# Patient Record
Sex: Female | Born: 1935
Health system: Southern US, Community
[De-identification: ages and names within clinical notes are randomized; demographics above are authoritative.]

## PROBLEM LIST (undated history)

## (undated) DIAGNOSIS — F0280 Dementia in other diseases classified elsewhere without behavioral disturbance: Secondary | ICD-10-CM

## (undated) DIAGNOSIS — E785 Hyperlipidemia, unspecified: Secondary | ICD-10-CM

## (undated) DIAGNOSIS — G309 Alzheimer's disease, unspecified: Secondary | ICD-10-CM

## (undated) DIAGNOSIS — M899 Disorder of bone, unspecified: Secondary | ICD-10-CM

## (undated) DIAGNOSIS — L03319 Cellulitis of trunk, unspecified: Secondary | ICD-10-CM

## (undated) DIAGNOSIS — M25569 Pain in unspecified knee: Secondary | ICD-10-CM

## (undated) DIAGNOSIS — L02219 Cutaneous abscess of trunk, unspecified: Secondary | ICD-10-CM

## (undated) DIAGNOSIS — F0390 Unspecified dementia without behavioral disturbance: Secondary | ICD-10-CM

## (undated) DIAGNOSIS — E119 Type 2 diabetes mellitus without complications: Secondary | ICD-10-CM

## (undated) DIAGNOSIS — I739 Peripheral vascular disease, unspecified: Secondary | ICD-10-CM

## (undated) DIAGNOSIS — I10 Essential (primary) hypertension: Secondary | ICD-10-CM

## (undated) DIAGNOSIS — M6281 Muscle weakness (generalized): Secondary | ICD-10-CM

## (undated) DIAGNOSIS — R7309 Other abnormal glucose: Secondary | ICD-10-CM

## (undated) DIAGNOSIS — D649 Anemia, unspecified: Secondary | ICD-10-CM

## (undated) DIAGNOSIS — M949 Disorder of cartilage, unspecified: Secondary | ICD-10-CM

## (undated) DIAGNOSIS — D72829 Elevated white blood cell count, unspecified: Secondary | ICD-10-CM

## (undated) HISTORY — DX: Muscle weakness (generalized): M62.81

## (undated) HISTORY — PX: OOPHORECTOMY: SHX86

## (undated) HISTORY — DX: Dementia in other diseases classified elsewhere, unspecified severity, without behavioral disturbance, psychotic disturbance, mood disturbance, and anxiety: F02.80

## (undated) HISTORY — DX: Disorder of bone, unspecified: M89.9

## (undated) HISTORY — DX: Cutaneous abscess of trunk, unspecified: L02.219

## (undated) HISTORY — DX: Hyperlipidemia, unspecified: E78.5

## (undated) HISTORY — DX: Peripheral vascular disease, unspecified: I73.9

## (undated) HISTORY — DX: Pain in unspecified knee: M25.569

## (undated) HISTORY — DX: Alzheimer's disease, unspecified: G30.9

## (undated) HISTORY — DX: Other abnormal glucose: R73.09

## (undated) HISTORY — DX: Essential (primary) hypertension: I10

## (undated) HISTORY — PX: TONSILLECTOMY: SHX5217

## (undated) HISTORY — DX: Anemia, unspecified: D64.9

## (undated) HISTORY — DX: Unspecified dementia, unspecified severity, without behavioral disturbance, psychotic disturbance, mood disturbance, and anxiety: F03.90

## (undated) HISTORY — DX: Type 2 diabetes mellitus without complications: E11.9

## (undated) HISTORY — DX: Cellulitis of trunk, unspecified: L03.319

## (undated) HISTORY — DX: Disorder of cartilage, unspecified: M94.9

## (undated) HISTORY — DX: Elevated white blood cell count, unspecified: D72.829

---

## 2009-12-05 ENCOUNTER — Encounter: Admission: RE | Admit: 2009-12-05 | Discharge: 2009-12-05 | Payer: Self-pay | Admitting: Internal Medicine

## 2011-01-08 ENCOUNTER — Other Ambulatory Visit: Payer: Self-pay | Admitting: Internal Medicine

## 2011-01-08 DIAGNOSIS — M25569 Pain in unspecified knee: Secondary | ICD-10-CM

## 2011-01-08 DIAGNOSIS — R0989 Other specified symptoms and signs involving the circulatory and respiratory systems: Secondary | ICD-10-CM

## 2011-01-12 ENCOUNTER — Ambulatory Visit
Admission: RE | Admit: 2011-01-12 | Discharge: 2011-01-12 | Disposition: A | Discharge status: Home / Self Care | Payer: Medicare Other | Source: Ambulatory Visit | Attending: Internal Medicine | Admitting: Internal Medicine

## 2011-01-12 ENCOUNTER — Other Ambulatory Visit: Payer: Self-pay

## 2011-01-12 DIAGNOSIS — R0989 Other specified symptoms and signs involving the circulatory and respiratory systems: Secondary | ICD-10-CM

## 2011-01-12 DIAGNOSIS — M25569 Pain in unspecified knee: Secondary | ICD-10-CM

## 2011-01-13 ENCOUNTER — Other Ambulatory Visit: Payer: Self-pay

## 2011-02-03 ENCOUNTER — Encounter (INDEPENDENT_AMBULATORY_CARE_PROVIDER_SITE_OTHER): Payer: Medicare Other | Admitting: Vascular Surgery

## 2011-02-03 DIAGNOSIS — M79609 Pain in unspecified limb: Secondary | ICD-10-CM

## 2011-02-03 NOTE — Consult Note (Signed)
NEW PATIENT CONSULTATION  Anne Hernandez, BONNELL CONNAREE DOB:  10/15/1930                                       02/03/2011 [id number]#:[id number]  Patient presents today for evaluation of potential lower extremity arterial insufficiency.  She is a very active, pleasant 77 year old white female who was found by Dr. Allena Katz to have diminished pedal pulses. This prompted a noninvasive vascular laboratory study showing mild lower extremity arterial insufficiency, and I am seeing her for further discussion.  Patient specifically denies any rest pain, tissue loss, and specifically denies any lower extremity claudication-type symptoms.  She is in a quite active walking program.  PAST HISTORY:  Significant only for hypertension.  SOCIAL HISTORY:  She is single with no children.  She does not smoke or drink alcohol.  REVIEW OF SYSTEMS:  Positive for weight gain.  She is up to 127 pounds. She is 5 feet 2 inches tall.  She does have arthritic and joint pain, most specifically in her knee, otherwise her review of systems is completely negative.  PHYSICAL EXAMINATION:  A well-developed and well-nourished white female appearing her stated age in no acute stress.  Blood pressure is 151/80, pulse 80, respirations 18.  HEENT is normal.  Her carotid arteries are without bruits bilaterally.  Heart:  Regular rate and rhythm.  Chest: Clear bilaterally without rales, rhonchi, or wheezes.  Abdomen:  Soft, nontender.  No masses noted.  Musculoskeletal shows no major deformities or cyanosis.  Neurologic:  No focal weakness or paresthesias.  Skin without ulcers or rashes.  She does have 2+ radial, 2+ femoral, 2+ popliteal, and 1-2+ dorsalis pedis pulses bilaterally.  I reviewed her noninvasive vascular laboratory studies with patient and her sister present.  I explained that this did show mild arterial insufficiency with an ankle-arm index of 0.72 on the left and 0.82 on the right.  She is not  having any symptoms related to this, and therefore I would recommend no further evaluation.  I explained the symptoms of claudication.  She will notify us should this occur, otherwise I explained that the most critical thing she is already doing is continuing her walking program.  She was reassured with this discussion and will see [place] again on an as-needed basis.    Larina Earthly, M.D. Electronically Signed  TFE/MEDQ  D:  02/03/2011  T:  02/03/2011  Job:  [id number]  cc:   Doran Durand, MD

## 2012-10-22 ENCOUNTER — Encounter: Payer: Self-pay | Admitting: Geriatric Medicine

## 2013-01-24 ENCOUNTER — Ambulatory Visit (INDEPENDENT_AMBULATORY_CARE_PROVIDER_SITE_OTHER): Payer: Medicare Other | Admitting: Internal Medicine

## 2013-01-24 ENCOUNTER — Encounter: Payer: Self-pay | Admitting: Geriatric Medicine

## 2013-01-24 ENCOUNTER — Telehealth: Payer: Self-pay | Admitting: Geriatric Medicine

## 2013-01-24 ENCOUNTER — Encounter: Payer: Self-pay | Admitting: Internal Medicine

## 2013-01-24 VITALS — BP 124/72 | HR 64 | Temp 97.6°F | Wt 121.0 lb

## 2013-01-24 DIAGNOSIS — L02219 Cutaneous abscess of trunk, unspecified: Secondary | ICD-10-CM

## 2013-01-24 DIAGNOSIS — I10 Essential (primary) hypertension: Secondary | ICD-10-CM

## 2013-01-24 DIAGNOSIS — E119 Type 2 diabetes mellitus without complications: Secondary | ICD-10-CM

## 2013-01-24 DIAGNOSIS — D72829 Elevated white blood cell count, unspecified: Secondary | ICD-10-CM

## 2013-01-24 DIAGNOSIS — E785 Hyperlipidemia, unspecified: Secondary | ICD-10-CM

## 2013-01-24 DIAGNOSIS — F0280 Dementia in other diseases classified elsewhere without behavioral disturbance: Secondary | ICD-10-CM

## 2013-01-24 DIAGNOSIS — D649 Anemia, unspecified: Secondary | ICD-10-CM

## 2013-01-24 DIAGNOSIS — M25569 Pain in unspecified knee: Secondary | ICD-10-CM

## 2013-01-24 DIAGNOSIS — L308 Other specified dermatitis: Secondary | ICD-10-CM

## 2013-01-24 DIAGNOSIS — M6281 Muscle weakness (generalized): Secondary | ICD-10-CM

## 2013-01-24 DIAGNOSIS — F0390 Unspecified dementia without behavioral disturbance: Secondary | ICD-10-CM

## 2013-01-24 DIAGNOSIS — M899 Disorder of bone, unspecified: Secondary | ICD-10-CM

## 2013-01-24 DIAGNOSIS — L299 Pruritus, unspecified: Secondary | ICD-10-CM

## 2013-01-24 DIAGNOSIS — R7309 Other abnormal glucose: Secondary | ICD-10-CM

## 2013-01-24 DIAGNOSIS — I739 Peripheral vascular disease, unspecified: Secondary | ICD-10-CM

## 2013-01-24 MED ORDER — CETIRIZINE HCL 10 MG PO TABS: 10.0000 mg | ORAL_TABLET | Freq: Every day | ORAL | Status: DC

## 2013-01-24 MED ORDER — HYDROCORTISONE 0.5 % EX CREA: TOPICAL_CREAM | Freq: Two times a day (BID) | CUTANEOUS | Status: DC

## 2013-01-24 MED ORDER — VASOTEC 10 MG PO TABS: 10.0000 mg | ORAL_TABLET | Freq: Every day | ORAL | Status: DC

## 2013-01-24 MED ORDER — HYDROCORTISONE 0.5 % EX CREA: TOPICAL_CREAM | Freq: Two times a day (BID) | CUTANEOUS | Status: AC

## 2013-01-24 MED ORDER — MEMANTINE HCL 28 X 5 MG & 21 X 10 MG PO TABS: ORAL_TABLET | ORAL | Status: DC

## 2013-01-24 NOTE — Assessment & Plan Note (Signed)
a1c in 9/13 was 6.6. Is currently diet controlled. Will check a1c prior to next visit. On ACEI

## 2013-01-24 NOTE — Assessment & Plan Note (Signed)
Elevated lipid level last visit- on diet, did not want simvastatin last viist. Recheck flp prior to next visit

## 2013-01-24 NOTE — Progress Notes (Signed)
  Subjective:    Patient ID: Anne Hernandez, female    DOB: 07-Dec-1929, 79 y.o.   MRN: [id number]  HPI 79 y/o female patient here with her sister for routine follow up visit. She has been having itching on face and neck for few months now. It can itch any time and she tries not to scratch them but in her sleep tends to. She has open sores on her face and neck now from itching and is using triple antibiotic cream for now. No rash or lesion reported elsewhere. Dementia persists but no acute behavioral changes reported. No assistive device used She is living at [facility] housing in [place].    Review of Systems  Constitutional: Negative for appetite change and fatigue.  HENT: Negative.  Negative for ear pain.   Respiratory: Negative.   Cardiovascular: Negative for chest pain, palpitations and leg swelling.  Gastrointestinal: Negative for constipation.  Musculoskeletal: Negative.   Neurological: Negative for dizziness.  Psychiatric/Behavioral: Negative.        Objective:   Physical Exam  Constitutional: She appears well-developed and well-nourished.  HENT:  Head: Normocephalic.  Eyes: Pupils are equal, round, and reactive to light.  Cardiovascular: Normal rate and regular rhythm.   Pulmonary/Chest: Effort normal and breath sounds normal.  Abdominal: Soft.  Musculoskeletal: Normal range of motion.  Neurological: She is alert.  Oriented to person and place but not to time, pleasant with conversation  Skin: Skin is warm. Rash noted. There is erythema.  Has scratch marks and open aream of 1 cm in forehead with erythema but no drainage. Has erythematous areas with thickened skin on the nape of her neck  Psychiatric: She has a normal mood and affect.          Assessment & Plan:

## 2013-01-24 NOTE — Assessment & Plan Note (Signed)
bp well controlled with current regimne. bp readings have been normal past few visits. On enalapril-hctz 10-25 mg daily. Will change this to enalapril 10 mg daily for now and monitor. Warning signs with elevated bp explained to pt. bp to be checked once a week in [facility] and to notify if reading persistently > 140/90

## 2013-01-24 NOTE — Progress Notes (Signed)
error 

## 2013-01-24 NOTE — Assessment & Plan Note (Signed)
On donepezil 10 mg daily. Memory problem persists and sister concerned with pt being more forgetful. No behavior changes reported. Will add namenda titration pack and explained to pt and sister of common side effects and mode of administration

## 2013-01-24 NOTE — Patient Instructions (Addendum)
To apply cream as prescribed on face twice a day.  If you notice gi upset, nausea, vomiting, abdominal pain or loss of appetite while using namenda, to notify the office Blood pressure to be checked once a week at home and to notify office if bp reading persistently > 140/90 i will see you in office in 2 weeks

## 2013-01-24 NOTE — Assessment & Plan Note (Signed)
On ca-vit d and otc vit d supplement- no falls

## 2013-02-07 ENCOUNTER — Other Ambulatory Visit: Payer: Self-pay | Admitting: Internal Medicine

## 2013-02-07 ENCOUNTER — Ambulatory Visit (INDEPENDENT_AMBULATORY_CARE_PROVIDER_SITE_OTHER): Payer: Medicare Other | Admitting: Internal Medicine

## 2013-02-07 ENCOUNTER — Other Ambulatory Visit: Payer: Self-pay | Admitting: Geriatric Medicine

## 2013-02-07 ENCOUNTER — Encounter: Payer: Self-pay | Admitting: Internal Medicine

## 2013-02-07 VITALS — BP 160/74 | HR 57 | Temp 97.7°F | Resp 14 | Wt 124.0 lb

## 2013-02-07 DIAGNOSIS — I10 Essential (primary) hypertension: Secondary | ICD-10-CM

## 2013-02-07 DIAGNOSIS — E119 Type 2 diabetes mellitus without complications: Secondary | ICD-10-CM

## 2013-02-07 DIAGNOSIS — F0280 Dementia in other diseases classified elsewhere without behavioral disturbance: Secondary | ICD-10-CM

## 2013-02-07 DIAGNOSIS — G309 Alzheimer's disease, unspecified: Secondary | ICD-10-CM

## 2013-02-07 DIAGNOSIS — L299 Pruritus, unspecified: Secondary | ICD-10-CM

## 2013-02-07 MED ORDER — LISINOPRIL-HYDROCHLOROTHIAZIDE 10-12.5 MG PO TABS: 1.0000 | ORAL_TABLET | Freq: Every day | ORAL | Status: DC

## 2013-02-07 MED ORDER — DONEPEZIL HCL 10 MG PO TABS: 10.0000 mg | ORAL_TABLET | Freq: Two times a day (BID) | ORAL | Status: DC

## 2013-02-07 NOTE — Assessment & Plan Note (Signed)
With bp remaining elevated will change vasotec to lisinopril-hctz 10-12.5 mg daily and reassess. Will have her bp be checked in the facility twice a week

## 2013-02-07 NOTE — Assessment & Plan Note (Signed)
Diet controlled. Will check a1c today

## 2013-02-07 NOTE — Assessment & Plan Note (Signed)
New erythema in upper lip today. The one in her neck and her forehead have resolved. Continue prn zyrtec and hydrocortisone cream for now. Pt encouraged to avoid picking on her face if possible. Nails are trimmed. Due to her memory issue, this can be a limitation though

## 2013-02-07 NOTE — Patient Instructions (Signed)
You will need your blood pressure checked at least twice a week in the facility ([facility]). Please have the facility call office and notify if bp reading is greater than 140/90 in 2 or more readings.  Two medication adjustment has been made as discussed this visit with you and your sister

## 2013-02-07 NOTE — Assessment & Plan Note (Signed)
Cannot take namenda due to cost issue. It was started last visit. Will increase donepezil to 10 mg bid for now.common side effects explained

## 2013-02-07 NOTE — Progress Notes (Signed)
Patient ID: Anne Hernandez, female   DOB: [birth year], 79 y.o.   MRN: [id number]  Chief Complaint  Patient presents with  . Medical Managment of Chronic Issues    dementia and rash   Allergies  Allergen Reactions  . Penicillins    HPI- Pt here for follow up on her memory and blood pressure. Her blood pressure has remained elevated since adjustment of the dose. She was not able to take namenda due to cost issue. No behavioral changes reported. Compliant with medication She continues to pick on her skin, says it is due to itching and has a new lesion on her upper lip area. Lesions present on last visit have resolved. No other complaints See ros  Review of Systems  Constitutional: Negative for fever and chills.  HENT: Negative for congestion.   Eyes: Negative for blurred vision.  Respiratory: Negative for cough and shortness of breath.   Cardiovascular: Negative for chest pain, palpitations and leg swelling.  Gastrointestinal: Negative for heartburn.  Genitourinary: Negative for dysuria.  Neurological: Negative for dizziness, weakness and headaches.  Psychiatric/Behavioral: Positive for memory loss.   BP 160/74  Pulse 57  Temp(Src) 97.7 F (36.5 C) (Oral)  Resp 14  Wt 124 lb (56.246 kg)  SpO2 97%  .Physical Exam  Constitutional: She appears well-developed and well-nourished.  HENT:  Head: Normocephalic and atraumatic.  Mouth/Throat: Oropharynx is clear and moist.  Eyes: Conjunctivae are normal. Pupils are equal, round, and reactive to light.  Neck: Normal range of motion. Neck supple.  Cardiovascular: Normal rate and regular rhythm.   Pulmonary/Chest: Effort normal and breath sounds normal.  Abdominal: Soft. Bowel sounds are normal.  Musculoskeletal: Normal range of motion. She exhibits no edema.  Neurological: She is alert.  Oriented to person and place and time  Skin: Skin is warm and dry. Rash noted. There is erythema.  Above upper lip 1x 0.5 cm with erythema, non  tender, no drainage  Psychiatric: She has a normal mood and affect.   ASSESSMENT/PLAN  Essential hypertension, benign With bp remaining elevated will change vasotec to lisinopril-hctz 10-12.5 mg daily and reassess. Will have her bp be checked in the facility twice a week  Type II or unspecified type diabetes mellitus without mention of complication, not stated as uncontrolled Diet controlled. Will check a1c today  Alzheimer's disease Cannot take namenda due to cost issue. It was started last visit. Will increase donepezil to 10 mg bid for now.common side effects explained  Pruritic dermatitis New erythema in upper lip today. The one in her neck and her forehead have resolved. Continue prn zyrtec and hydrocortisone cream for now. Pt encouraged to avoid picking on her face if possible. Nails are trimmed. Due to her memory issue, this can be a limitation though   Will get her a1c and flp checked today

## 2013-02-08 LAB — CBC WITH DIFFERENTIAL/PLATELET
Basos: 1 % (ref 0–3)
Eos: 1 % (ref 0–5)
Eosinophils Absolute: 0.1 10*3/uL (ref 0.0–0.4)
Immature Grans (Abs): 0 10*3/uL (ref 0.0–0.1)
Immature Granulocytes: 0 % (ref 0–2)
Lymphs: 33 % (ref 14–46)
Monocytes Absolute: 0.6 10*3/uL (ref 0.1–0.9)
Neutrophils Relative %: 58 % (ref 40–74)
RBC: 4.28 x10E6/uL (ref 3.77–5.28)
RDW: 12.6 % (ref 12.3–15.4)
WBC: 8.2 10*3/uL (ref 3.4–10.8)

## 2013-02-08 LAB — BASIC METABOLIC PANEL
BUN: 10 mg/dL (ref 8–27)
CO2: 24 mmol/L (ref 19–28)
Calcium: 9.8 mg/dL (ref 8.6–10.2)
Creatinine, Ser: 0.76 mg/dL (ref 0.57–1.00)
GFR calc non Af Amer: 74 mL/min/{1.73_m2} (ref >59)
Sodium: 140 mmol/L (ref 134–144)

## 2013-02-08 LAB — LIPID PANEL
Cholesterol, Total: 272 mg/dL — ABNORMAL HIGH (ref 100–199)
HDL: 78 mg/dL (ref >39)
VLDL Cholesterol Cal: 30 mg/dL (ref 5–40)

## 2013-02-08 LAB — HEMOGLOBIN A1C
Est. average glucose Bld gHb Est-mCnc: 140 mg/dL
Hgb A1c MFr Bld: 6.5 % — ABNORMAL HIGH (ref 4.8–5.6)

## 2013-02-14 NOTE — Progress Notes (Signed)
Pt has been notified by Avis Epley of the results

## 2013-06-13 ENCOUNTER — Ambulatory Visit (INDEPENDENT_AMBULATORY_CARE_PROVIDER_SITE_OTHER): Payer: Self-pay | Admitting: Internal Medicine

## 2013-06-13 ENCOUNTER — Encounter: Payer: Self-pay | Admitting: Internal Medicine

## 2013-06-13 VITALS — BP 142/78 | HR 59 | Temp 97.9°F | Resp 14 | Wt 120.4 lb

## 2013-06-13 DIAGNOSIS — F0280 Dementia in other diseases classified elsewhere without behavioral disturbance: Secondary | ICD-10-CM

## 2013-06-13 DIAGNOSIS — E119 Type 2 diabetes mellitus without complications: Secondary | ICD-10-CM

## 2013-06-13 DIAGNOSIS — M899 Disorder of bone, unspecified: Secondary | ICD-10-CM

## 2013-06-13 DIAGNOSIS — I10 Essential (primary) hypertension: Secondary | ICD-10-CM

## 2013-06-13 DIAGNOSIS — G309 Alzheimer's disease, unspecified: Secondary | ICD-10-CM

## 2013-06-13 DIAGNOSIS — E785 Hyperlipidemia, unspecified: Secondary | ICD-10-CM

## 2013-06-13 NOTE — Progress Notes (Signed)
Patient ID: Anne Hernandez, female   DOB: 02/19/1930, 79 y.o.   MRN: [id number]  Chief Complaint  Patient presents with  . Medical Managment of Chronic Issues   Allergies  Allergen Reactions  . Penicillins     HPI Pt here for routine follow up on her memory and blood pressure. Her bp has remained stable at [facility]. She has been eating well, participating in activities. No concerns from pt or her daughter  Review of Systems  Constitutional: Negative for fever and chills.  HENT: Negative for congestion.   Eyes: Negative for blurred vision.  Respiratory: Negative for cough and shortness of breath.   Cardiovascular: Negative for chest pain, palpitations and leg swelling.  Gastrointestinal: Negative for heartburn.  Genitourinary: Negative for dysuria.  Neurological: Negative for dizziness, weakness and headaches.  Psychiatric/Behavioral: Positive for memory loss.    Physical exam-  BP 142/78  Pulse 59  Temp(Src) 97.9 F (36.6 C) (Oral)  Resp 14  Wt 120 lb 6.4 oz (54.613 kg)  Constitutional: She appears well-developed and well-nourished.  HENT:   Head: Normocephalic and atraumatic.   Cardiovascular: Normal rate and regular rhythm.   Pulmonary/Chest: Effort normal and breath sounds normal.  Abdominal: Soft. Bowel sounds are normal.  Musculoskeletal: Normal range of motion. She exhibits no edema.  Neurological: She is alert.  Oriented to person and place and time  Skin: Skin is warm and dry.  Psychiatric: She has a normal mood and affect  Assessment/plan  Essential hypertension, benign bp well controlled with lisinopril-hctz 10-12.5 mg daily   Type II or unspecified type diabetes mellitus without mention of complication, not stated as uncontrolled Diet controlled.  Alzheimer's disease Continue donepezil 10 mg bid for now  Hyperlipidemia Does not want to start statin, goal < 130. Continue krill oil and monitor lipid panel. Supportive management  Disorder of  bone and cartilage Continue calcium-vit d

## 2013-11-29 ENCOUNTER — Other Ambulatory Visit: Payer: Self-pay | Admitting: *Deleted

## 2013-11-29 ENCOUNTER — Other Ambulatory Visit: Payer: Medicare Other

## 2013-11-29 DIAGNOSIS — E119 Type 2 diabetes mellitus without complications: Secondary | ICD-10-CM

## 2013-11-29 DIAGNOSIS — D649 Anemia, unspecified: Secondary | ICD-10-CM

## 2013-11-29 DIAGNOSIS — I10 Essential (primary) hypertension: Secondary | ICD-10-CM

## 2013-11-29 DIAGNOSIS — E785 Hyperlipidemia, unspecified: Secondary | ICD-10-CM

## 2013-11-30 ENCOUNTER — Other Ambulatory Visit: Payer: Medicare Other

## 2013-12-01 ENCOUNTER — Other Ambulatory Visit: Payer: Medicare Other

## 2013-12-01 DIAGNOSIS — D649 Anemia, unspecified: Secondary | ICD-10-CM

## 2013-12-01 DIAGNOSIS — E785 Hyperlipidemia, unspecified: Secondary | ICD-10-CM

## 2013-12-01 DIAGNOSIS — E119 Type 2 diabetes mellitus without complications: Secondary | ICD-10-CM

## 2013-12-01 DIAGNOSIS — I10 Essential (primary) hypertension: Secondary | ICD-10-CM

## 2013-12-02 LAB — COMPREHENSIVE METABOLIC PANEL
A/G RATIO: 2.1 (ref 1.1–2.5)
ALT: 5 IU/L (ref 0–32)
AST: 18 IU/L (ref 0–40)
Albumin: 4.4 g/dL (ref 3.5–4.7)
Alkaline Phosphatase: 84 IU/L (ref 39–117)
BUN/Creatinine Ratio: 15 (ref 11–26)
BUN: 13 mg/dL (ref 8–27)
CALCIUM: 10.2 mg/dL (ref 8.7–10.3)
CO2: 26 mmol/L (ref 18–29)
Chloride: 97 mmol/L (ref 97–108)
Creatinine, Ser: 0.86 mg/dL (ref 0.57–1.00)
GFR calc Af Amer: 73 mL/min/{1.73_m2} (ref >59)
GFR calc non Af Amer: 63 mL/min/{1.73_m2} (ref >59)
Globulin, Total: 2.1 g/dL (ref 1.5–4.5)
Glucose: 121 mg/dL — ABNORMAL HIGH (ref 65–99)
POTASSIUM: 4.1 mmol/L (ref 3.5–5.2)
SODIUM: 138 mmol/L (ref 134–144)
Total Bilirubin: 0.4 mg/dL (ref 0.0–1.2)
Total Protein: 6.5 g/dL (ref 6.0–8.5)

## 2013-12-02 LAB — CBC WITH DIFFERENTIAL/PLATELET
BASOS ABS: 0 10*3/uL (ref 0.0–0.2)
Basos: 0 %
EOS ABS: 0.1 10*3/uL (ref 0.0–0.4)
Eos: 1 %
HCT: 36.7 % (ref 34.0–46.6)
Hemoglobin: 12.8 g/dL (ref 11.1–15.9)
IMMATURE GRANS (ABS): 0 10*3/uL (ref 0.0–0.1)
IMMATURE GRANULOCYTES: 0 %
Lymphocytes Absolute: 3.1 10*3/uL (ref 0.7–3.1)
Lymphs: 39 %
MCH: 30 pg (ref 26.6–33.0)
MCHC: 34.9 g/dL (ref 31.5–35.7)
MCV: 86 fL (ref 79–97)
MONOS ABS: 0.4 10*3/uL (ref 0.1–0.9)
Monocytes: 5 %
NEUTROS PCT: 55 %
Neutrophils Absolute: 4.3 10*3/uL (ref 1.4–7.0)
RBC: 4.27 x10E6/uL (ref 3.77–5.28)
RDW: 13.4 % (ref 12.3–15.4)
WBC: 8 10*3/uL (ref 3.4–10.8)

## 2013-12-02 LAB — LIPID PANEL
CHOLESTEROL TOTAL: 303 mg/dL — AB (ref 100–199)
Chol/HDL Ratio: 3.2 ratio units (ref 0.0–4.4)
HDL: 96 mg/dL (ref >39)
LDL CALC: 183 mg/dL — AB (ref 0–99)
Triglycerides: 121 mg/dL (ref 0–149)
VLDL Cholesterol Cal: 24 mg/dL (ref 5–40)

## 2013-12-02 LAB — HEMOGLOBIN A1C
ESTIMATED AVERAGE GLUCOSE: 146 mg/dL
Hgb A1c MFr Bld: 6.7 % — ABNORMAL HIGH (ref 4.8–5.6)

## 2013-12-13 ENCOUNTER — Ambulatory Visit: Payer: Medicare Other | Admitting: Internal Medicine

## 2013-12-13 ENCOUNTER — Encounter: Payer: Self-pay | Admitting: Internal Medicine

## 2013-12-13 ENCOUNTER — Ambulatory Visit (INDEPENDENT_AMBULATORY_CARE_PROVIDER_SITE_OTHER): Payer: Medicare Other | Admitting: Internal Medicine

## 2013-12-13 VITALS — BP 126/78 | HR 68 | Temp 98.2°F | Resp 10 | Wt 114.0 lb

## 2013-12-13 DIAGNOSIS — E785 Hyperlipidemia, unspecified: Secondary | ICD-10-CM

## 2013-12-13 DIAGNOSIS — M949 Disorder of cartilage, unspecified: Secondary | ICD-10-CM

## 2013-12-13 DIAGNOSIS — F0150 Vascular dementia without behavioral disturbance: Secondary | ICD-10-CM | POA: Insufficient documentation

## 2013-12-13 DIAGNOSIS — G309 Alzheimer's disease, unspecified: Secondary | ICD-10-CM

## 2013-12-13 DIAGNOSIS — L739 Follicular disorder, unspecified: Secondary | ICD-10-CM

## 2013-12-13 DIAGNOSIS — L738 Other specified follicular disorders: Secondary | ICD-10-CM

## 2013-12-13 DIAGNOSIS — M899 Disorder of bone, unspecified: Secondary | ICD-10-CM

## 2013-12-13 DIAGNOSIS — E119 Type 2 diabetes mellitus without complications: Secondary | ICD-10-CM

## 2013-12-13 DIAGNOSIS — L678 Other hair color and hair shaft abnormalities: Secondary | ICD-10-CM

## 2013-12-13 DIAGNOSIS — I10 Essential (primary) hypertension: Secondary | ICD-10-CM

## 2013-12-13 DIAGNOSIS — F0280 Dementia in other diseases classified elsewhere without behavioral disturbance: Secondary | ICD-10-CM | POA: Insufficient documentation

## 2013-12-13 MED ORDER — ATORVASTATIN CALCIUM 10 MG PO TABS: ORAL_TABLET | ORAL | Status: DC

## 2013-12-13 MED ORDER — ENALAPRIL-HYDROCHLOROTHIAZIDE 10-25 MG PO TABS: ORAL_TABLET | ORAL | Status: DC

## 2013-12-13 MED ORDER — DONEPEZIL HCL 10 MG PO TABS: ORAL_TABLET | ORAL | Status: DC

## 2013-12-13 NOTE — Progress Notes (Signed)
Patient ID: Anne Hernandez, female   DOB: 14-Oct-1930, 80 y.o.   MRN: [id number]    Chief Complaint  Patient presents with  . Medical Managment of Chronic Issues    6 month follow-up, discuss labs    Allergies  Allergen Reactions  . Penicillins    HPI 80 y/o femlae pt here for follow up. Her bp is stable. She has been eating well, participating in activities. No concerns from pt or her daughter. She resides in independent living at [facility].  Review of Systems  Constitutional: Negative for fever, chills, weight loss, malaise/fatigue and diaphoresis.  HENT: Negative for congestion, hearing loss and sore throat.  had clear nasal discharge and scratchy throat this am Eyes: Negative for blurred vision, double vision and discharge.  Respiratory: Negative for cough, sputum production, shortness of breath and wheezing.   Cardiovascular: Negative for chest pain, palpitations, orthopnea and leg swelling.  Gastrointestinal: Negative for heartburn, nausea, vomiting, abdominal pain, diarrhea and constipation.  Genitourinary: Negative for dysuria, urgency, frequency and flank pain.  Musculoskeletal: Negative for back pain, falls, joint pain and myalgias.  Skin: Negative for itching and rash.  Neurological: Negative for dizziness, tingling, focal weakness and headaches.  Psychiatric/Behavioral: Negative for depression.The patient is not nervous/anxious.  sleeping well at night. Has memory loss with slow progession. No behavior changes reported by daughter. Daughter helps with laundry and checks on her personal hygiene   Past Medical History  Diagnosis Date  . Alzheimer's disease   . Hyperlipidemia   . Peripheral vascular disease, unspecified   . Hypertension   . Pain in joint, lower leg   . Other abnormal glucose   . Disorder of bone and cartilage, unspecified   . Anemia   . Leukocytosis, unspecified   . Cellulitis and abscess of trunk   . Muscle weakness (generalized)   . Type II or  unspecified type diabetes mellitus without mention of complication, not stated as uncontrolled   . Senile dementia, uncomplicated    Past Surgical History  Procedure Laterality Date  . Tonsillectomy    . Oophorectomy      Current Outpatient Prescriptions on File Prior to Visit  Medication Sig Dispense Refill  . calcium-vitamin D (OSCAL WITH D) 500-200 MG-UNIT per tablet Take 600 tablets by mouth 2 (two) times daily.      . cholecalciferol (VITAMIN D) 1000 UNITS tablet Take 1,000 Units by mouth daily. Take one tablet once daily for vitamin d supplement      . Krill Oil (MAXIMUM RED KRILL) 300 MG CAPS Take one tablet once daily       No current facility-administered medications on file prior to visit.    Physical exam BP 126/78  Pulse 68  Temp(Src) 98.2 F (36.8 C) (Oral)  Resp 10  Wt 114 lb (51.71 kg)  SpO2 98%  Constitutional: She appears well-developed and well-nourished.   HENT:   Head: Normocephalic and atraumatic.   Cardiovascular: Normal rate and regular rhythm.    Pulmonary/Chest: Effort normal and breath sounds normal.   Abdominal: Soft. Bowel sounds are normal.  Musculoskeletal: Normal range of motion. She exhibits no edema.  Neurological: She is alert. Oriented to person and place and time   Skin: Skin is warm and dry. Dried follicular area around the mouth on face with mild erythema, non tender,  Psychiatric: She has a normal mood and affect   Labs- CBC    Component Value Date/Time   WBC 8.0 12/01/2013 0853   RBC 4.27  12/01/2013 0853   HGB 12.8 12/01/2013 0853   HCT 36.7 12/01/2013 0853   MCV 86 12/01/2013 0853   MCH 30.0 12/01/2013 0853   MCHC 34.9 12/01/2013 0853   RDW 13.4 12/01/2013 0853   LYMPHSABS 3.1 12/01/2013 0853   EOSABS 0.1 12/01/2013 0853   BASOSABS 0.0 12/01/2013 0853   Lab Results  Component Value Date   HGBA1C 6.7* 12/01/2013    Lipid Panel     Component Value Date/Time   TRIG 121 12/01/2013 0853   HDL 96 12/01/2013 0853   CHOLHDL 3.2  12/01/2013 0853   LDLCALC 183* 12/01/2013 0853     Assessment/plan  Mixed demnetia- alzhimer's and vascular Continue donepezil 10 mg bid for now. Monitor bp readings. Elevated cholesterol level  Hyperlipidemia goal < 130. Worsened ldl and low HDL level. Pt agrees to start statin. Will start her on simvastatin 5 mg daily  Essential hypertension, benign bp well controlled. continue lisinopril-hctz 10-12.5 mg daily   Folliculitis Stable. Continue prn OTC antibiotic ointment  Type II or unspecified type diabetes mellitus without mention of complication, not stated as uncontrolled Diet controlled. Reviewed a1c, slow increase but will have her off meds as long as a1c is < 7  Disorder of bone and cartilage Continue ca-vit d supplement

## 2014-04-12 ENCOUNTER — Other Ambulatory Visit: Payer: Medicare Other

## 2014-04-12 DIAGNOSIS — E119 Type 2 diabetes mellitus without complications: Secondary | ICD-10-CM

## 2014-04-12 DIAGNOSIS — I10 Essential (primary) hypertension: Secondary | ICD-10-CM

## 2014-04-12 DIAGNOSIS — E785 Hyperlipidemia, unspecified: Secondary | ICD-10-CM

## 2014-04-13 LAB — COMPREHENSIVE METABOLIC PANEL
ALBUMIN: 4.4 g/dL (ref 3.5–4.7)
ALT: 6 IU/L (ref 0–32)
AST: 14 IU/L (ref 0–40)
Albumin/Globulin Ratio: 2.2 (ref 1.1–2.5)
Alkaline Phosphatase: 84 IU/L (ref 39–117)
BUN/Creatinine Ratio: 14 (ref 11–26)
BUN: 12 mg/dL (ref 8–27)
CALCIUM: 9.7 mg/dL (ref 8.7–10.3)
CHLORIDE: 101 mmol/L (ref 97–108)
CO2: 24 mmol/L (ref 18–29)
CREATININE: 0.83 mg/dL (ref 0.57–1.00)
GFR calc Af Amer: 76 mL/min/{1.73_m2} (ref >59)
GFR calc non Af Amer: 66 mL/min/{1.73_m2} (ref >59)
GLOBULIN, TOTAL: 2 g/dL (ref 1.5–4.5)
GLUCOSE: 121 mg/dL — AB (ref 65–99)
Potassium: 4.3 mmol/L (ref 3.5–5.2)
Sodium: 139 mmol/L (ref 134–144)
TOTAL PROTEIN: 6.4 g/dL (ref 6.0–8.5)
Total Bilirubin: 0.4 mg/dL (ref 0.0–1.2)

## 2014-04-13 LAB — HEMOGLOBIN A1C
ESTIMATED AVERAGE GLUCOSE: 151 mg/dL
HEMOGLOBIN A1C: 6.9 % — AB (ref 4.8–5.6)

## 2014-04-13 LAB — CK: CK TOTAL: 41 U/L (ref 24–173)

## 2014-04-17 ENCOUNTER — Ambulatory Visit (INDEPENDENT_AMBULATORY_CARE_PROVIDER_SITE_OTHER): Payer: Medicare Other | Admitting: Internal Medicine

## 2014-04-17 VITALS — BP 118/70 | HR 72 | Temp 97.0°F | Resp 18 | Wt 108.0 lb

## 2014-04-17 DIAGNOSIS — I10 Essential (primary) hypertension: Secondary | ICD-10-CM

## 2014-04-17 DIAGNOSIS — F0150 Vascular dementia without behavioral disturbance: Secondary | ICD-10-CM

## 2014-04-17 DIAGNOSIS — E785 Hyperlipidemia, unspecified: Secondary | ICD-10-CM

## 2014-04-17 DIAGNOSIS — E119 Type 2 diabetes mellitus without complications: Secondary | ICD-10-CM

## 2014-04-17 DIAGNOSIS — F0280 Dementia in other diseases classified elsewhere without behavioral disturbance: Secondary | ICD-10-CM

## 2014-04-17 DIAGNOSIS — G309 Alzheimer's disease, unspecified: Secondary | ICD-10-CM

## 2014-04-17 MED ORDER — MEMANTINE HCL ER 7 & 14 & 21 &28 MG PO CP24: ORAL_CAPSULE | ORAL | Status: DC

## 2014-04-17 MED ORDER — ENALAPRIL MALEATE 10 MG PO TABS: 10.0000 mg | ORAL_TABLET | Freq: Every day | ORAL | Status: DC

## 2014-04-17 NOTE — Progress Notes (Signed)
Patient ID: Anne Hernandez, female   DOB: 12/18/1929, 80 y.o.   MRN: [id number]    Chief Complaint  Patient presents with  . Follow-up   Allergies  Allergen Reactions  . Penicillins    HPI 80 y/o femlae pt here for follow up. Her bp is stable.No falls reported. No new skin concern. No acute behavioral changes. Has lost some weight. She has been eating well, participating in activities. She resides in independent living at [facility]. Her daughter is here with her. She has been more forgetful recently.   Review of Systems  Constitutional: Negative for fever, chills, malaise/fatigue and diaphoresis.  HENT: Negative for congestion, hearing loss and sore throat.  Eyes: Negative for blurred vision, double vision and discharge.  Respiratory: Negative for cough, sputum production, shortness of breath and wheezing.   Cardiovascular: Negative for chest pain, palpitations, orthopnea and leg swelling.  Gastrointestinal: Negative for heartburn, nausea, vomiting, abdominal pain, diarrhea and constipation.  Genitourinary: Negative for dysuria, urgency, frequency and flank pain.  Musculoskeletal: Negative for back pain, falls, joint pain and myalgias.  Skin: Negative for itching and rash.  Neurological: Negative for dizziness, tingling, focal weakness and headaches.  Psychiatric/Behavioral: Negative for depression.The patient is not nervous/anxious. sleeping well at night. Has memory loss with slow progession. No behavior changes reported by daughter. Daughter helps with laundry and checks on her personal hygiene  Past Medical History  Diagnosis Date  . Alzheimer's disease   . Hyperlipidemia   . Peripheral vascular disease, unspecified   . Hypertension   . Pain in joint, lower leg   . Other abnormal glucose   . Disorder of bone and cartilage, unspecified   . Anemia   . Leukocytosis, unspecified   . Cellulitis and abscess of trunk   . Muscle weakness (generalized)   . Type II or  unspecified type diabetes mellitus without mention of complication, not stated as uncontrolled   . Senile dementia, uncomplicated    Current Outpatient Prescriptions on File Prior to Visit  Medication Sig Dispense Refill  . atorvastatin (LIPITOR) 10 MG tablet Take half a tablet= 5 mg once daily at bedtime  30 tablet  3  . calcium-vitamin D (OSCAL WITH D) 500-200 MG-UNIT per tablet Take 600 tablets by mouth 2 (two) times daily.      . cholecalciferol (VITAMIN D) 1000 UNITS tablet Take 1,000 Units by mouth daily. Take one tablet once daily for vitamin d supplement      . donepezil (ARICEPT) 10 MG tablet Take one tablet once daily for memory.  90 tablet  1  . Krill Oil (MAXIMUM RED KRILL) 300 MG CAPS Take one tablet once daily       No current facility-administered medications on file prior to visit.    Physical exam BP 118/70  Pulse 72  Temp(Src) 97 F (36.1 C) (Oral)  Resp 18  Wt 108 lb (48.988 kg)  SpO2 96%  Constitutional: She appears well-developed and well-nourished.   HENT:   Head: Normocephalic and atraumatic.   Cardiovascular: Normal rate and regular rhythm.    Pulmonary/Chest: Effort normal and breath sounds normal.   Abdominal: Soft. Bowel sounds are normal.  Musculoskeletal: Normal range of motion. She exhibits no edema.  Neurological: She is alert. Oriented to person and place and time   Skin: Skin is warm and dry.  Psychiatric: She has a normal mood and affect  Labs- CMP     Component Value Date/Time   NA 139 04/12/2014 0843  K 4.3 04/12/2014 0843   CL 101 04/12/2014 0843   CO2 24 04/12/2014 0843   GLUCOSE 121* 04/12/2014 0843   BUN 12 04/12/2014 0843   CREATININE 0.83 04/12/2014 0843   CALCIUM 9.7 04/12/2014 0843   PROT 6.4 04/12/2014 0843   AST 14 04/12/2014 0843   ALT 6 04/12/2014 0843   ALKPHOS 84 04/12/2014 0843   BILITOT 0.4 04/12/2014 0843   GFRNONAA 66 04/12/2014 0843   GFRAA 76 04/12/2014 0843   Lab Results  Component Value Date   HGBA1C 6.9* 04/12/2014    Assessment/plan  Essential hypertension, benign bp well controlled. Given her age and well controlled bp readings, will d/c enalapril-hctz and have her on enalapril 10 mg daily only. Reassess in 6-8 weeks  Mixed demnetia- alzhimer's and vascular Slow decline and also has weight loss. Will start her on namenda xr titration pack (sample provided) and then continue namenda 28 mg daily if tolerated. If continues to lose weight, i will discontinue her donepezil (possible cause). Continue atorvastatin  Hyperlipidemia goal < 130. Continue atorvastatin. Check lipid panel   Type II or unspecified type diabetes mellitus without mention of complication, not stated as uncontrolled Diet controlled. Reviewed a1c, goal a1c is < 7

## 2014-04-17 NOTE — Patient Instructions (Signed)
Stop taking liisnopril-Hydrochlorthiazide from today and take your new blood pressure medications

## 2014-04-24 LAB — SPECIMEN STATUS REPORT

## 2014-04-25 LAB — LIPID PANEL W/O CHOL/HDL RATIO
CHOLESTEROL TOTAL: 254 mg/dL — AB (ref 100–199)
HDL: 93 mg/dL (ref >39)
LDL Calculated: 136 mg/dL — ABNORMAL HIGH (ref 0–99)
TRIGLYCERIDES: 125 mg/dL (ref 0–149)
VLDL Cholesterol Cal: 25 mg/dL (ref 5–40)

## 2014-04-25 LAB — SPECIMEN STATUS REPORT

## 2014-04-27 ENCOUNTER — Encounter: Payer: Self-pay | Admitting: *Deleted

## 2014-06-19 ENCOUNTER — Encounter: Payer: Self-pay | Admitting: Internal Medicine

## 2014-06-19 ENCOUNTER — Ambulatory Visit: Payer: Medicare Other | Admitting: Internal Medicine

## 2014-06-19 ENCOUNTER — Ambulatory Visit (INDEPENDENT_AMBULATORY_CARE_PROVIDER_SITE_OTHER): Payer: Medicare Other | Admitting: Internal Medicine

## 2014-06-19 VITALS — BP 140/82 | HR 68 | Temp 97.6°F | Ht 64.0 in | Wt 105.8 lb

## 2014-06-19 DIAGNOSIS — F0150 Vascular dementia without behavioral disturbance: Secondary | ICD-10-CM

## 2014-06-19 DIAGNOSIS — G309 Alzheimer's disease, unspecified: Secondary | ICD-10-CM

## 2014-06-19 DIAGNOSIS — Z23 Encounter for immunization: Secondary | ICD-10-CM

## 2014-06-19 DIAGNOSIS — I10 Essential (primary) hypertension: Secondary | ICD-10-CM

## 2014-06-19 DIAGNOSIS — E785 Hyperlipidemia, unspecified: Secondary | ICD-10-CM

## 2014-06-19 DIAGNOSIS — E119 Type 2 diabetes mellitus without complications: Secondary | ICD-10-CM

## 2014-06-19 DIAGNOSIS — F0280 Dementia in other diseases classified elsewhere without behavioral disturbance: Secondary | ICD-10-CM

## 2014-06-19 MED ORDER — MEMANTINE HCL-DONEPEZIL HCL ER 28-10 MG PO CP24: 1.0000 | ORAL_CAPSULE | Freq: Every day | ORAL | Status: DC

## 2014-06-19 NOTE — Progress Notes (Signed)
Patient ID: Anne GageMyra CONNAREE Hernandez, female   DOB: 02-21-1930, 80 y.o.   MRN: [id number]    Chief Complaint  Patient presents with  . Follow-up    2 month f/u   . other    fall/depression screening Neg. & forgetting to take medications on a daily   . Immunizations    ? Tdap and Pneumo Vaccine   Allergies  Allergen Reactions  . Penicillins    HPI 80 y/o female pt here for follow up. bp elevated in office 152/76. Pt not sure if had her medication this am. As per daughter noted pt to have forgotten medications once or twice a week. She resides in independent living facility and tolerated namenda and aricept well. This was started last visit. Her memory continues to be a problem. No falls reported. No new skin concern. No acute behavioral changes. She has been eating well, participating in activities. Has not received prevnar before and has not had tetanus vaccine  Review of Systems   Constitutional: Negative for fever, chills, malaise/fatigue and diaphoresis.   HENT: Negative for congestion, hearing loss and sore throat.  Eyes: Negative for blurred vision, double vision and discharge.   Respiratory: Negative for cough, sputum production, shortness of breath and wheezing.    Cardiovascular: Negative for chest pain, palpitations, orthopnea and leg swelling.   Gastrointestinal: Negative for heartburn, nausea, vomiting, abdominal pain, diarrhea and constipation.   Genitourinary: Negative for dysuria, urgency, frequency and flank pain.   Musculoskeletal: Negative for back pain, falls, joint pain and myalgias.   Skin: Negative for itching and rash.   Neurological: Negative for dizziness, tingling, focal weakness and headaches.   Psychiatric/Behavioral: Negative for depression.The patient is not nervous/anxious. sleeping well at night. Daughter helps with laundry and checks on her personal hygiene  Past Medical History  Diagnosis Date  . Alzheimer's disease   . Hyperlipidemia   . Peripheral  vascular disease, unspecified   . Hypertension   . Pain in joint, lower leg   . Other abnormal glucose   . Disorder of bone and cartilage, unspecified   . Anemia   . Leukocytosis, unspecified   . Cellulitis and abscess of trunk   . Muscle weakness (generalized)   . Type II or unspecified type diabetes mellitus without mention of complication, not stated as uncontrolled   . Senile dementia, uncomplicated    Current Outpatient Prescriptions on File Prior to Visit  Medication Sig Dispense Refill  . atorvastatin (LIPITOR) 10 MG tablet Take half a tablet= 5 mg once daily at bedtime  30 tablet  3  . calcium-vitamin D (OSCAL WITH D) 500-200 MG-UNIT per tablet Take  2 tablets daily.      . cholecalciferol (VITAMIN D) 1000 UNITS tablet Take 1,000 Units by mouth daily. Take one tablet once daily for vitamin d supplement      . donepezil (ARICEPT) 10 MG tablet Take one tablet once daily for memory.  90 tablet  1  . enalapril (VASOTEC) 10 MG tablet Take 1 tablet (10 mg total) by mouth daily.  30 tablet  3  . Krill Oil (MAXIMUM RED KRILL) 300 MG CAPS Take one tablet once daily      . Memantine HCl ER (NAMENDA XR TITRATION PACK) 7 & 14 & 21 &28 MG CP24 Sample provided  28 capsule  0   No current facility-administered medications on file prior to visit.   Physical exam BP 140/82  Pulse 68  Temp(Src) 97.6 F (36.4 C) (Oral)  Ht 5\' 4"  (1.626 m)  Wt 105 lb 12.8 oz (47.991 kg)  BMI 18.15 kg/m2  SpO2 98%  Constitutional: She appears well-developed and well-nourished.   HENT:   Head: Normocephalic and atraumatic.   Cardiovascular: Normal rate and regular rhythm.    Pulmonary/Chest: Effort normal and breath sounds normal.   Abdominal: Soft. Bowel sounds are normal.  Musculoskeletal: Normal range of motion. She exhibits no edema.  Neurological: She is alert. Oriented to person and place and time   Skin: Skin is warm and dry.   Psychiatric: She has a normal mood and affect  Labs- Lab Results    Component Value Date   HGBA1C 6.9* 04/12/2014   Lipid Panel     Component Value Date/Time   TRIG 125 04/12/2014 0843   HDL 93 04/12/2014 0843   CHOLHDL 3.2 12/01/2013 0853   LDLCALC 136* 04/12/2014 0843    Assessment/plan  1. Essential hypertension, benign Repeat bp check normal. Continue enalapril 10mg  daily - CBC with Differential; Future  2. Type II or unspecified type diabetes mellitus without mention of complication, not stated as uncontrolled Normal foot exam. Off all meds for now. Diet controlled. Reviewed lipid. Continue lipitor. prevnar given today. Check urine microalbumin. Continue ACEI - Microalbumin/Creatinine Ratio, Urine - Hemoglobin A1c; Future - CMP; Future - CBC with Differential; Future - Pneumococcal conjugate vaccine 13-valent  3. Mixed Alzheimer's and vascular dementia Change namenda xr and aricept to one pill namzaric daily, script provided.,decline anticipated - CBC with Differential; Future  4. Other and unspecified hyperlipidemia Continue lipitor daily - Lipid Panel; Future  5. Need for prophylactic vaccination with Streptococcus pneumoniae (Pneumococcus) and Influenza vaccines - Pneumococcal conjugate vaccine 13-valent

## 2014-06-21 ENCOUNTER — Other Ambulatory Visit: Payer: Medicare Other

## 2014-06-22 ENCOUNTER — Encounter: Payer: Self-pay | Admitting: *Deleted

## 2014-06-22 LAB — MICROALBUMIN / CREATININE URINE RATIO
Creatinine, Ur: 171.3 mg/dL (ref 15.0–278.0)
MICROALB/CREAT RATIO: 18.6 mg/g{creat} (ref 0.0–30.0)
Microalbumin, Urine: 31.8 ug/mL — ABNORMAL HIGH (ref 0.0–17.0)

## 2014-06-27 ENCOUNTER — Telehealth: Payer: Self-pay | Admitting: *Deleted

## 2014-06-27 NOTE — Telephone Encounter (Signed)
Had to get Prior Authorization for patient's [facility]. Form Sent and filled out and faxed back. Went to Clinical Review. Approved for a year. Pharmacy Notified and Patient Notified.

## 2014-11-16 ENCOUNTER — Other Ambulatory Visit: Payer: Medicare Other

## 2014-11-20 ENCOUNTER — Ambulatory Visit: Payer: Medicare Other | Admitting: Internal Medicine

## 2015-11-05 ENCOUNTER — Encounter: Payer: Self-pay | Admitting: Nurse Practitioner

## 2015-11-05 ENCOUNTER — Ambulatory Visit (INDEPENDENT_AMBULATORY_CARE_PROVIDER_SITE_OTHER): Payer: Medicare Other | Admitting: Nurse Practitioner

## 2015-11-05 VITALS — BP 154/82 | HR 68 | Temp 98.0°F | Resp 20 | Ht 64.0 in | Wt 104.6 lb

## 2015-11-05 DIAGNOSIS — F0150 Vascular dementia without behavioral disturbance: Secondary | ICD-10-CM

## 2015-11-05 DIAGNOSIS — F0280 Dementia in other diseases classified elsewhere without behavioral disturbance: Secondary | ICD-10-CM

## 2015-11-05 DIAGNOSIS — E785 Hyperlipidemia, unspecified: Secondary | ICD-10-CM

## 2015-11-05 DIAGNOSIS — I10 Essential (primary) hypertension: Secondary | ICD-10-CM | POA: Diagnosis not present

## 2015-11-05 DIAGNOSIS — E119 Type 2 diabetes mellitus without complications: Secondary | ICD-10-CM

## 2015-11-05 DIAGNOSIS — G309 Alzheimer's disease, unspecified: Secondary | ICD-10-CM

## 2015-11-05 NOTE — Progress Notes (Signed)
Patient ID: Anne Hernandez, female   DOB: Jan 17, 1930, 81 y.o.   MRN: [id number]    PCP: Oneal Grout, MD  Advanced Directive information Does patient have an advance directive?: Yes, Type of Advance Directive: Healthcare Power of Attorney  Allergies  Allergen Reactions  . Penicillins     Chief Complaint  Patient presents with  . Medical Management of Chronic Issues      Follow and from completion FL2  . OTHER    Sister in room with patient  . OTHER    Patient is not currently taking any medication      HPI: Patient is a 81 y.o. female seen in the office today to have FL2 form filled out. Pt with a pmh of dementia, hyperlipidemia, htn, DM. Pt has not been here in over a year and looking for long term placement at [facility] memory care. Has been doing well. Not been taking any medication.  In the lats 4-6 weeks memory issues have gotten worse.   Here today with sister, currently living at [facility]. Memory medication was not beneficial so they stopped. Clinic at [facility] and sister reports blood pressure was good there- unsure what actual numbers were but she said they reported it to her as normal.  Previously on cholesterol medication however due to memory loss does not wish to cont to be on this medication.  Blood sugar yesterday 149, nonfasting.  Does not routinely take blood sugar. Never took medication for blood sugar. Due to memory progression sister reports she does not wish to be aggressive with following blood sugar.   Review of Systems:  Review of Systems  Constitutional: Negative for activity change, appetite change, fatigue and unexpected weight change.  HENT: Negative for congestion and hearing loss.   Eyes: Negative.   Respiratory: Negative for cough and shortness of breath.   Cardiovascular: Negative for chest pain, palpitations and leg swelling.  Gastrointestinal: Negative for abdominal pain, diarrhea and constipation.  Genitourinary: Negative for  dysuria and difficulty urinating.  Musculoskeletal: Positive for arthralgias (pain in left hip/lower back, does not complain but will show signs of pain occasionally). Negative for myalgias.  Skin: Negative for color change and wound.  Neurological: Negative for dizziness and weakness.  Psychiatric/Behavioral: Positive for confusion. Negative for behavioral problems and agitation. The patient is not nervous/anxious.        Worsening memory loss    Past Medical History  Diagnosis Date  . Alzheimer's disease   . Hyperlipidemia   . Peripheral vascular disease, unspecified (HCC)   . Hypertension   . Pain in joint, lower leg   . Other abnormal glucose   . Disorder of bone and cartilage, unspecified   . Anemia   . Leukocytosis, unspecified   . Cellulitis and abscess of trunk   . Muscle weakness (generalized)   . Type II or unspecified type diabetes mellitus without mention of complication, not stated as uncontrolled   . Senile dementia, uncomplicated    Past Surgical History  Procedure Laterality Date  . Tonsillectomy    . Oophorectomy     Social History:   reports that she has never smoked. She has never used smokeless tobacco. She reports that she drinks about 0.5 oz of alcohol per week. She reports that she does not use illicit drugs.  Family History  Problem Relation Age of Onset  . Cancer Mother 21    Breast Cancer  . Heart disease Father 29    CHF  . Heart  disease Brother     MI    Medications: Patient's Medications  New Prescriptions   No medications on file  Previous Medications   No medications on file  Modified Medications   No medications on file  Discontinued Medications   ATORVASTATIN (LIPITOR) 10 MG TABLET    Take half a tablet= 5 mg once daily at bedtime   CALCIUM-VITAMIN D (OSCAL WITH D) 500-200 MG-UNIT PER TABLET    Take  2 tablets daily.   CHOLECALCIFEROL (VITAMIN D) 1000 UNITS TABLET    Take 1,000 Units by mouth daily. Take one tablet once daily for  vitamin d supplement   DONEPEZIL (ARICEPT) 10 MG TABLET    Take one tablet once daily for memory.   ENALAPRIL (VASOTEC) 10 MG TABLET    Take 1 tablet (10 mg total) by mouth daily.   KRILL OIL (MAXIMUM RED KRILL) 300 MG CAPS    Take one tablet once daily   MEMANTINE HCL ER (NAMENDA XR TITRATION PACK) 7 & 14 & 21 &28 MG CP24    Sample provided   MEMANTINE HCL-DONEPEZIL HCL (NAMZARIC) 28-10 MG CP24    Take 1 capsule by mouth daily.     Physical Exam:  Filed Vitals:   11/05/15 0947  BP: 154/82  Pulse: 68  Temp: 98 F (36.7 C)  TempSrc: Oral  Resp: 20  Height: 5\' 4"  (1.626 m)  Weight: 104 lb 9.6 oz (47.446 kg)  SpO2: 97%   Body mass index is 17.95 kg/(m^2).  Physical Exam  Constitutional: She is oriented to person, place, and time. She appears well-developed and well-nourished. No distress.  HENT:  Head: Normocephalic and atraumatic.  Mouth/Throat: Oropharynx is clear and moist. No oropharyngeal exudate.  Eyes: Conjunctivae are normal. Pupils are equal, round, and reactive to light.  Neck: Normal range of motion. Neck supple.  Cardiovascular: Normal rate, regular rhythm and normal heart sounds.   Pulmonary/Chest: Effort normal and breath sounds normal.  Abdominal: Soft. Bowel sounds are normal.  Musculoskeletal: She exhibits no edema or tenderness.  Neurological: She is alert and oriented to person, place, and time.  Skin: Skin is warm and dry. She is not diaphoretic.  Psychiatric: Cognition and memory are impaired. She exhibits abnormal recent memory and abnormal remote memory.    Labs reviewed: Basic Metabolic Panel: No results for input(s): NA, K, CL, CO2, GLUCOSE, BUN, CREATININE, CALCIUM, MG, PHOS, TSH in the last 8760 hours. Liver Function Tests: No results for input(s): AST, ALT, ALKPHOS, BILITOT, PROT, ALBUMIN in the last 8760 hours. No results for input(s): LIPASE, AMYLASE in the last 8760 hours. No results for input(s): AMMONIA in the last 8760 hours. CBC: No  results for input(s): WBC, NEUTROABS, HGB, HCT, MCV, PLT in the last 8760 hours. Lipid Panel: No results for input(s): CHOL, HDL, LDLCALC, TRIG, CHOLHDL, LDLDIRECT in the last 8760 hours. TSH: No results for input(s): TSH in the last 8760 hours. A1C: Lab Results  Component Value Date   HGBA1C 6.9* 04/12/2014     Assessment/Plan 1. Essential hypertension, benign -currently off all medication, reports checks at facility are within normal range and would not want this too aggressively followed due to memory loss  2. Mixed Alzheimer's and vascular dementia Ongoing worsening memory loss, did not feel like medication for memory was effective and therefore stopped.   3. Hyperlipidemia -currently off medication, does not want follow up on lipids at this time.  4. Type 2 diabetes mellitus without complication, without long-term current use of  insulin (HCC) -never been on medication, controlled with diet and exercise. Will need ongoing monitoring at facility.  FL2 form completed. Pt not current on any medications. Plans to follow up with physician at [facility].    Janene Harvey. Biagio Borg  [facility] [phone] 8 am - 5 pm) [phone] (after hours)

## 2015-12-12 DIAGNOSIS — R2689 Other abnormalities of gait and mobility: Secondary | ICD-10-CM | POA: Diagnosis not present

## 2015-12-12 DIAGNOSIS — R41841 Cognitive communication deficit: Secondary | ICD-10-CM | POA: Diagnosis not present

## 2015-12-12 DIAGNOSIS — M6281 Muscle weakness (generalized): Secondary | ICD-10-CM | POA: Diagnosis not present

## 2015-12-12 DIAGNOSIS — F0390 Unspecified dementia without behavioral disturbance: Secondary | ICD-10-CM | POA: Diagnosis not present

## 2015-12-16 DIAGNOSIS — G301 Alzheimer's disease with late onset: Secondary | ICD-10-CM | POA: Diagnosis not present

## 2015-12-16 DIAGNOSIS — I10 Essential (primary) hypertension: Secondary | ICD-10-CM | POA: Diagnosis not present

## 2015-12-16 DIAGNOSIS — E119 Type 2 diabetes mellitus without complications: Secondary | ICD-10-CM | POA: Diagnosis not present

## 2016-01-13 DIAGNOSIS — E119 Type 2 diabetes mellitus without complications: Secondary | ICD-10-CM | POA: Diagnosis not present

## 2016-01-13 DIAGNOSIS — I10 Essential (primary) hypertension: Secondary | ICD-10-CM | POA: Diagnosis not present

## 2016-01-13 DIAGNOSIS — G301 Alzheimer's disease with late onset: Secondary | ICD-10-CM | POA: Diagnosis not present

## 2016-03-12 DIAGNOSIS — I10 Essential (primary) hypertension: Secondary | ICD-10-CM | POA: Diagnosis not present

## 2016-03-12 DIAGNOSIS — G301 Alzheimer's disease with late onset: Secondary | ICD-10-CM | POA: Diagnosis not present

## 2016-03-12 DIAGNOSIS — E119 Type 2 diabetes mellitus without complications: Secondary | ICD-10-CM | POA: Diagnosis not present

## 2016-05-16 DIAGNOSIS — Z79899 Other long term (current) drug therapy: Secondary | ICD-10-CM | POA: Diagnosis not present

## 2016-05-16 DIAGNOSIS — N390 Urinary tract infection, site not specified: Secondary | ICD-10-CM | POA: Diagnosis not present

## 2016-05-16 DIAGNOSIS — R319 Hematuria, unspecified: Secondary | ICD-10-CM | POA: Diagnosis not present

## 2016-05-20 DIAGNOSIS — E119 Type 2 diabetes mellitus without complications: Secondary | ICD-10-CM | POA: Diagnosis not present

## 2016-05-20 DIAGNOSIS — G301 Alzheimer's disease with late onset: Secondary | ICD-10-CM | POA: Diagnosis not present

## 2016-05-20 DIAGNOSIS — I10 Essential (primary) hypertension: Secondary | ICD-10-CM | POA: Diagnosis not present

## 2016-05-20 DIAGNOSIS — N390 Urinary tract infection, site not specified: Secondary | ICD-10-CM | POA: Diagnosis not present

## 2016-05-21 DIAGNOSIS — E119 Type 2 diabetes mellitus without complications: Secondary | ICD-10-CM | POA: Diagnosis not present

## 2016-07-15 DIAGNOSIS — I129 Hypertensive chronic kidney disease with stage 1 through stage 4 chronic kidney disease, or unspecified chronic kidney disease: Secondary | ICD-10-CM | POA: Diagnosis not present

## 2016-07-15 DIAGNOSIS — N183 Chronic kidney disease, stage 3 (moderate): Secondary | ICD-10-CM | POA: Diagnosis not present

## 2016-07-15 DIAGNOSIS — G301 Alzheimer's disease with late onset: Secondary | ICD-10-CM | POA: Diagnosis not present

## 2016-07-15 DIAGNOSIS — E1121 Type 2 diabetes mellitus with diabetic nephropathy: Secondary | ICD-10-CM | POA: Diagnosis not present

## 2016-07-15 DIAGNOSIS — F325 Major depressive disorder, single episode, in full remission: Secondary | ICD-10-CM | POA: Diagnosis not present

## 2016-07-15 DIAGNOSIS — M79673 Pain in unspecified foot: Secondary | ICD-10-CM | POA: Diagnosis not present

## 2016-09-17 DIAGNOSIS — E119 Type 2 diabetes mellitus without complications: Secondary | ICD-10-CM | POA: Diagnosis not present

## 2016-09-17 DIAGNOSIS — I10 Essential (primary) hypertension: Secondary | ICD-10-CM | POA: Diagnosis not present

## 2016-09-17 DIAGNOSIS — G301 Alzheimer's disease with late onset: Secondary | ICD-10-CM | POA: Diagnosis not present

## 2016-10-22 DIAGNOSIS — E119 Type 2 diabetes mellitus without complications: Secondary | ICD-10-CM | POA: Diagnosis not present

## 2016-10-22 DIAGNOSIS — I10 Essential (primary) hypertension: Secondary | ICD-10-CM | POA: Diagnosis not present

## 2016-10-22 DIAGNOSIS — G301 Alzheimer's disease with late onset: Secondary | ICD-10-CM | POA: Diagnosis not present

## 2016-10-23 DIAGNOSIS — I10 Essential (primary) hypertension: Secondary | ICD-10-CM | POA: Diagnosis not present

## 2016-10-23 DIAGNOSIS — E1165 Type 2 diabetes mellitus with hyperglycemia: Secondary | ICD-10-CM | POA: Diagnosis not present

## 2016-12-13 DIAGNOSIS — G301 Alzheimer's disease with late onset: Secondary | ICD-10-CM | POA: Diagnosis not present

## 2017-01-29 DIAGNOSIS — E119 Type 2 diabetes mellitus without complications: Secondary | ICD-10-CM | POA: Diagnosis not present

## 2017-01-29 DIAGNOSIS — E1165 Type 2 diabetes mellitus with hyperglycemia: Secondary | ICD-10-CM | POA: Diagnosis not present

## 2017-02-14 DIAGNOSIS — I10 Essential (primary) hypertension: Secondary | ICD-10-CM | POA: Diagnosis not present

## 2017-02-14 DIAGNOSIS — E119 Type 2 diabetes mellitus without complications: Secondary | ICD-10-CM | POA: Diagnosis not present

## 2017-02-14 DIAGNOSIS — G301 Alzheimer's disease with late onset: Secondary | ICD-10-CM | POA: Diagnosis not present

## 2017-03-25 DIAGNOSIS — E1165 Type 2 diabetes mellitus with hyperglycemia: Secondary | ICD-10-CM | POA: Diagnosis not present

## 2017-03-25 DIAGNOSIS — E0800 Diabetes mellitus due to underlying condition with hyperosmolarity without nonketotic hyperglycemic-hyperosmolar coma (NKHHC): Secondary | ICD-10-CM | POA: Diagnosis not present

## 2017-03-25 DIAGNOSIS — E119 Type 2 diabetes mellitus without complications: Secondary | ICD-10-CM | POA: Diagnosis not present

## 2017-03-25 DIAGNOSIS — R319 Hematuria, unspecified: Secondary | ICD-10-CM | POA: Diagnosis not present

## 2017-04-14 DIAGNOSIS — E119 Type 2 diabetes mellitus without complications: Secondary | ICD-10-CM | POA: Diagnosis not present

## 2017-04-14 DIAGNOSIS — I10 Essential (primary) hypertension: Secondary | ICD-10-CM | POA: Diagnosis not present

## 2017-04-14 DIAGNOSIS — G301 Alzheimer's disease with late onset: Secondary | ICD-10-CM | POA: Diagnosis not present

## 2017-06-14 DIAGNOSIS — I10 Essential (primary) hypertension: Secondary | ICD-10-CM | POA: Diagnosis not present

## 2017-06-14 DIAGNOSIS — E119 Type 2 diabetes mellitus without complications: Secondary | ICD-10-CM | POA: Diagnosis not present

## 2017-06-14 DIAGNOSIS — G301 Alzheimer's disease with late onset: Secondary | ICD-10-CM | POA: Diagnosis not present

## 2017-09-27 DIAGNOSIS — E785 Hyperlipidemia, unspecified: Secondary | ICD-10-CM | POA: Diagnosis not present

## 2017-09-27 DIAGNOSIS — M6281 Muscle weakness (generalized): Secondary | ICD-10-CM | POA: Diagnosis not present

## 2017-09-27 DIAGNOSIS — G8929 Other chronic pain: Secondary | ICD-10-CM | POA: Diagnosis not present

## 2017-09-27 DIAGNOSIS — I10 Essential (primary) hypertension: Secondary | ICD-10-CM | POA: Diagnosis not present

## 2018-01-12 DIAGNOSIS — R4182 Altered mental status, unspecified: Secondary | ICD-10-CM | POA: Diagnosis not present

## 2018-01-13 DIAGNOSIS — R319 Hematuria, unspecified: Secondary | ICD-10-CM | POA: Diagnosis not present

## 2018-01-13 DIAGNOSIS — N390 Urinary tract infection, site not specified: Secondary | ICD-10-CM | POA: Diagnosis not present

## 2018-02-17 DIAGNOSIS — G8929 Other chronic pain: Secondary | ICD-10-CM | POA: Diagnosis not present

## 2018-02-17 DIAGNOSIS — M6281 Muscle weakness (generalized): Secondary | ICD-10-CM | POA: Diagnosis not present

## 2018-03-01 DIAGNOSIS — E119 Type 2 diabetes mellitus without complications: Secondary | ICD-10-CM | POA: Diagnosis not present

## 2018-03-01 DIAGNOSIS — M6281 Muscle weakness (generalized): Secondary | ICD-10-CM | POA: Diagnosis not present

## 2018-03-01 DIAGNOSIS — I10 Essential (primary) hypertension: Secondary | ICD-10-CM | POA: Diagnosis not present

## 2018-03-01 DIAGNOSIS — G4700 Insomnia, unspecified: Secondary | ICD-10-CM | POA: Diagnosis not present

## 2018-03-03 DIAGNOSIS — E785 Hyperlipidemia, unspecified: Secondary | ICD-10-CM | POA: Diagnosis not present

## 2018-03-03 DIAGNOSIS — M6281 Muscle weakness (generalized): Secondary | ICD-10-CM | POA: Diagnosis not present

## 2018-03-03 DIAGNOSIS — G8929 Other chronic pain: Secondary | ICD-10-CM | POA: Diagnosis not present

## 2018-03-03 DIAGNOSIS — F0150 Vascular dementia without behavioral disturbance: Secondary | ICD-10-CM | POA: Diagnosis not present

## 2018-07-28 DIAGNOSIS — G8929 Other chronic pain: Secondary | ICD-10-CM | POA: Diagnosis not present

## 2018-07-28 DIAGNOSIS — M6281 Muscle weakness (generalized): Secondary | ICD-10-CM | POA: Diagnosis not present

## 2018-07-28 DIAGNOSIS — E119 Type 2 diabetes mellitus without complications: Secondary | ICD-10-CM | POA: Diagnosis not present

## 2018-07-28 DIAGNOSIS — I10 Essential (primary) hypertension: Secondary | ICD-10-CM | POA: Diagnosis not present

## 2018-08-22 DIAGNOSIS — Z79899 Other long term (current) drug therapy: Secondary | ICD-10-CM | POA: Diagnosis not present

## 2018-08-22 DIAGNOSIS — R319 Hematuria, unspecified: Secondary | ICD-10-CM | POA: Diagnosis not present

## 2018-08-22 DIAGNOSIS — N390 Urinary tract infection, site not specified: Secondary | ICD-10-CM | POA: Diagnosis not present

## 2018-08-22 DIAGNOSIS — R300 Dysuria: Secondary | ICD-10-CM | POA: Diagnosis not present

## 2018-08-26 DIAGNOSIS — N390 Urinary tract infection, site not specified: Secondary | ICD-10-CM | POA: Diagnosis not present

## 2018-08-29 DIAGNOSIS — S0180XA Unspecified open wound of other part of head, initial encounter: Secondary | ICD-10-CM | POA: Diagnosis not present

## 2018-08-29 DIAGNOSIS — Z9181 History of falling: Secondary | ICD-10-CM | POA: Diagnosis not present

## 2018-08-29 DIAGNOSIS — N390 Urinary tract infection, site not specified: Secondary | ICD-10-CM | POA: Diagnosis not present

## 2018-11-04 DIAGNOSIS — Z79899 Other long term (current) drug therapy: Secondary | ICD-10-CM | POA: Diagnosis not present

## 2018-11-04 DIAGNOSIS — R69 Illness, unspecified: Secondary | ICD-10-CM | POA: Diagnosis not present

## 2018-11-05 DIAGNOSIS — R319 Hematuria, unspecified: Secondary | ICD-10-CM | POA: Diagnosis not present

## 2018-11-05 DIAGNOSIS — N390 Urinary tract infection, site not specified: Secondary | ICD-10-CM | POA: Diagnosis not present

## 2018-11-05 DIAGNOSIS — Z79899 Other long term (current) drug therapy: Secondary | ICD-10-CM | POA: Diagnosis not present

## 2018-11-08 DIAGNOSIS — E119 Type 2 diabetes mellitus without complications: Secondary | ICD-10-CM | POA: Diagnosis not present

## 2018-11-08 DIAGNOSIS — Z79899 Other long term (current) drug therapy: Secondary | ICD-10-CM | POA: Diagnosis not present

## 2018-11-09 DIAGNOSIS — E119 Type 2 diabetes mellitus without complications: Secondary | ICD-10-CM | POA: Diagnosis not present

## 2018-11-09 DIAGNOSIS — R2689 Other abnormalities of gait and mobility: Secondary | ICD-10-CM | POA: Diagnosis not present

## 2018-11-09 DIAGNOSIS — F0390 Unspecified dementia without behavioral disturbance: Secondary | ICD-10-CM | POA: Diagnosis not present

## 2018-11-09 DIAGNOSIS — R1312 Dysphagia, oropharyngeal phase: Secondary | ICD-10-CM | POA: Diagnosis not present

## 2018-11-09 DIAGNOSIS — F0150 Vascular dementia without behavioral disturbance: Secondary | ICD-10-CM | POA: Diagnosis not present

## 2018-11-10 DIAGNOSIS — R1312 Dysphagia, oropharyngeal phase: Secondary | ICD-10-CM | POA: Diagnosis not present

## 2018-11-10 DIAGNOSIS — E119 Type 2 diabetes mellitus without complications: Secondary | ICD-10-CM | POA: Diagnosis not present

## 2018-11-10 DIAGNOSIS — F0150 Vascular dementia without behavioral disturbance: Secondary | ICD-10-CM | POA: Diagnosis not present

## 2018-11-10 DIAGNOSIS — N390 Urinary tract infection, site not specified: Secondary | ICD-10-CM | POA: Diagnosis not present

## 2018-11-10 DIAGNOSIS — R2689 Other abnormalities of gait and mobility: Secondary | ICD-10-CM | POA: Diagnosis not present

## 2018-11-10 DIAGNOSIS — F0390 Unspecified dementia without behavioral disturbance: Secondary | ICD-10-CM | POA: Diagnosis not present

## 2018-11-13 DIAGNOSIS — F0390 Unspecified dementia without behavioral disturbance: Secondary | ICD-10-CM | POA: Diagnosis not present

## 2018-11-13 DIAGNOSIS — R1312 Dysphagia, oropharyngeal phase: Secondary | ICD-10-CM | POA: Diagnosis not present

## 2018-11-13 DIAGNOSIS — E119 Type 2 diabetes mellitus without complications: Secondary | ICD-10-CM | POA: Diagnosis not present

## 2018-11-13 DIAGNOSIS — F0150 Vascular dementia without behavioral disturbance: Secondary | ICD-10-CM | POA: Diagnosis not present

## 2018-11-13 DIAGNOSIS — R2689 Other abnormalities of gait and mobility: Secondary | ICD-10-CM | POA: Diagnosis not present

## 2018-11-14 DIAGNOSIS — E119 Type 2 diabetes mellitus without complications: Secondary | ICD-10-CM | POA: Diagnosis not present

## 2018-11-14 DIAGNOSIS — R1312 Dysphagia, oropharyngeal phase: Secondary | ICD-10-CM | POA: Diagnosis not present

## 2018-11-14 DIAGNOSIS — R2689 Other abnormalities of gait and mobility: Secondary | ICD-10-CM | POA: Diagnosis not present

## 2018-11-14 DIAGNOSIS — F0390 Unspecified dementia without behavioral disturbance: Secondary | ICD-10-CM | POA: Diagnosis not present

## 2018-11-14 DIAGNOSIS — F0150 Vascular dementia without behavioral disturbance: Secondary | ICD-10-CM | POA: Diagnosis not present

## 2018-11-15 DIAGNOSIS — F0150 Vascular dementia without behavioral disturbance: Secondary | ICD-10-CM | POA: Diagnosis not present

## 2018-11-15 DIAGNOSIS — F0390 Unspecified dementia without behavioral disturbance: Secondary | ICD-10-CM | POA: Diagnosis not present

## 2018-11-15 DIAGNOSIS — R1312 Dysphagia, oropharyngeal phase: Secondary | ICD-10-CM | POA: Diagnosis not present

## 2018-11-15 DIAGNOSIS — R2689 Other abnormalities of gait and mobility: Secondary | ICD-10-CM | POA: Diagnosis not present

## 2018-11-15 DIAGNOSIS — E119 Type 2 diabetes mellitus without complications: Secondary | ICD-10-CM | POA: Diagnosis not present

## 2018-11-16 DIAGNOSIS — R2689 Other abnormalities of gait and mobility: Secondary | ICD-10-CM | POA: Diagnosis not present

## 2018-11-16 DIAGNOSIS — R1312 Dysphagia, oropharyngeal phase: Secondary | ICD-10-CM | POA: Diagnosis not present

## 2018-11-16 DIAGNOSIS — E119 Type 2 diabetes mellitus without complications: Secondary | ICD-10-CM | POA: Diagnosis not present

## 2018-11-16 DIAGNOSIS — F0150 Vascular dementia without behavioral disturbance: Secondary | ICD-10-CM | POA: Diagnosis not present

## 2018-11-16 DIAGNOSIS — F0390 Unspecified dementia without behavioral disturbance: Secondary | ICD-10-CM | POA: Diagnosis not present

## 2018-11-18 DIAGNOSIS — F0150 Vascular dementia without behavioral disturbance: Secondary | ICD-10-CM | POA: Diagnosis not present

## 2018-11-18 DIAGNOSIS — R2689 Other abnormalities of gait and mobility: Secondary | ICD-10-CM | POA: Diagnosis not present

## 2018-11-18 DIAGNOSIS — R1312 Dysphagia, oropharyngeal phase: Secondary | ICD-10-CM | POA: Diagnosis not present

## 2018-11-18 DIAGNOSIS — F0390 Unspecified dementia without behavioral disturbance: Secondary | ICD-10-CM | POA: Diagnosis not present

## 2018-11-18 DIAGNOSIS — E119 Type 2 diabetes mellitus without complications: Secondary | ICD-10-CM | POA: Diagnosis not present

## 2018-11-20 DIAGNOSIS — R1312 Dysphagia, oropharyngeal phase: Secondary | ICD-10-CM | POA: Diagnosis not present

## 2018-11-20 DIAGNOSIS — R2689 Other abnormalities of gait and mobility: Secondary | ICD-10-CM | POA: Diagnosis not present

## 2018-11-20 DIAGNOSIS — E119 Type 2 diabetes mellitus without complications: Secondary | ICD-10-CM | POA: Diagnosis not present

## 2018-11-20 DIAGNOSIS — F0390 Unspecified dementia without behavioral disturbance: Secondary | ICD-10-CM | POA: Diagnosis not present

## 2018-11-20 DIAGNOSIS — F0150 Vascular dementia without behavioral disturbance: Secondary | ICD-10-CM | POA: Diagnosis not present

## 2018-11-21 DIAGNOSIS — R1312 Dysphagia, oropharyngeal phase: Secondary | ICD-10-CM | POA: Diagnosis not present

## 2018-11-21 DIAGNOSIS — F0390 Unspecified dementia without behavioral disturbance: Secondary | ICD-10-CM | POA: Diagnosis not present

## 2018-11-21 DIAGNOSIS — E119 Type 2 diabetes mellitus without complications: Secondary | ICD-10-CM | POA: Diagnosis not present

## 2018-11-21 DIAGNOSIS — R2689 Other abnormalities of gait and mobility: Secondary | ICD-10-CM | POA: Diagnosis not present

## 2018-11-21 DIAGNOSIS — F0150 Vascular dementia without behavioral disturbance: Secondary | ICD-10-CM | POA: Diagnosis not present

## 2018-11-22 DIAGNOSIS — F0150 Vascular dementia without behavioral disturbance: Secondary | ICD-10-CM | POA: Diagnosis not present

## 2018-11-22 DIAGNOSIS — R2689 Other abnormalities of gait and mobility: Secondary | ICD-10-CM | POA: Diagnosis not present

## 2018-11-22 DIAGNOSIS — E119 Type 2 diabetes mellitus without complications: Secondary | ICD-10-CM | POA: Diagnosis not present

## 2018-11-22 DIAGNOSIS — R1312 Dysphagia, oropharyngeal phase: Secondary | ICD-10-CM | POA: Diagnosis not present

## 2018-11-22 DIAGNOSIS — F0390 Unspecified dementia without behavioral disturbance: Secondary | ICD-10-CM | POA: Diagnosis not present

## 2018-11-23 DIAGNOSIS — R2689 Other abnormalities of gait and mobility: Secondary | ICD-10-CM | POA: Diagnosis not present

## 2018-11-23 DIAGNOSIS — E119 Type 2 diabetes mellitus without complications: Secondary | ICD-10-CM | POA: Diagnosis not present

## 2018-11-23 DIAGNOSIS — F0390 Unspecified dementia without behavioral disturbance: Secondary | ICD-10-CM | POA: Diagnosis not present

## 2018-11-23 DIAGNOSIS — F0150 Vascular dementia without behavioral disturbance: Secondary | ICD-10-CM | POA: Diagnosis not present

## 2018-11-23 DIAGNOSIS — R1312 Dysphagia, oropharyngeal phase: Secondary | ICD-10-CM | POA: Diagnosis not present

## 2018-11-24 DIAGNOSIS — E119 Type 2 diabetes mellitus without complications: Secondary | ICD-10-CM | POA: Diagnosis not present

## 2018-11-24 DIAGNOSIS — F0150 Vascular dementia without behavioral disturbance: Secondary | ICD-10-CM | POA: Diagnosis not present

## 2018-11-24 DIAGNOSIS — R2689 Other abnormalities of gait and mobility: Secondary | ICD-10-CM | POA: Diagnosis not present

## 2018-11-24 DIAGNOSIS — R1312 Dysphagia, oropharyngeal phase: Secondary | ICD-10-CM | POA: Diagnosis not present

## 2018-11-24 DIAGNOSIS — F0390 Unspecified dementia without behavioral disturbance: Secondary | ICD-10-CM | POA: Diagnosis not present

## 2018-11-25 DIAGNOSIS — E119 Type 2 diabetes mellitus without complications: Secondary | ICD-10-CM | POA: Diagnosis not present

## 2018-11-25 DIAGNOSIS — R2689 Other abnormalities of gait and mobility: Secondary | ICD-10-CM | POA: Diagnosis not present

## 2018-11-25 DIAGNOSIS — F0150 Vascular dementia without behavioral disturbance: Secondary | ICD-10-CM | POA: Diagnosis not present

## 2018-11-25 DIAGNOSIS — F0390 Unspecified dementia without behavioral disturbance: Secondary | ICD-10-CM | POA: Diagnosis not present

## 2018-11-25 DIAGNOSIS — R1312 Dysphagia, oropharyngeal phase: Secondary | ICD-10-CM | POA: Diagnosis not present

## 2018-11-28 DIAGNOSIS — R2689 Other abnormalities of gait and mobility: Secondary | ICD-10-CM | POA: Diagnosis not present

## 2018-11-28 DIAGNOSIS — F0390 Unspecified dementia without behavioral disturbance: Secondary | ICD-10-CM | POA: Diagnosis not present

## 2018-11-28 DIAGNOSIS — E119 Type 2 diabetes mellitus without complications: Secondary | ICD-10-CM | POA: Diagnosis not present

## 2018-11-28 DIAGNOSIS — F0150 Vascular dementia without behavioral disturbance: Secondary | ICD-10-CM | POA: Diagnosis not present

## 2018-11-28 DIAGNOSIS — R1312 Dysphagia, oropharyngeal phase: Secondary | ICD-10-CM | POA: Diagnosis not present

## 2018-11-29 DIAGNOSIS — F0150 Vascular dementia without behavioral disturbance: Secondary | ICD-10-CM | POA: Diagnosis not present

## 2018-11-29 DIAGNOSIS — F0390 Unspecified dementia without behavioral disturbance: Secondary | ICD-10-CM | POA: Diagnosis not present

## 2018-11-29 DIAGNOSIS — R2689 Other abnormalities of gait and mobility: Secondary | ICD-10-CM | POA: Diagnosis not present

## 2018-11-29 DIAGNOSIS — R1312 Dysphagia, oropharyngeal phase: Secondary | ICD-10-CM | POA: Diagnosis not present

## 2018-11-29 DIAGNOSIS — E119 Type 2 diabetes mellitus without complications: Secondary | ICD-10-CM | POA: Diagnosis not present

## 2018-11-30 DIAGNOSIS — R2689 Other abnormalities of gait and mobility: Secondary | ICD-10-CM | POA: Diagnosis not present

## 2018-11-30 DIAGNOSIS — R1312 Dysphagia, oropharyngeal phase: Secondary | ICD-10-CM | POA: Diagnosis not present

## 2018-11-30 DIAGNOSIS — E119 Type 2 diabetes mellitus without complications: Secondary | ICD-10-CM | POA: Diagnosis not present

## 2018-11-30 DIAGNOSIS — F0390 Unspecified dementia without behavioral disturbance: Secondary | ICD-10-CM | POA: Diagnosis not present

## 2018-11-30 DIAGNOSIS — F0150 Vascular dementia without behavioral disturbance: Secondary | ICD-10-CM | POA: Diagnosis not present

## 2018-12-01 DIAGNOSIS — R1312 Dysphagia, oropharyngeal phase: Secondary | ICD-10-CM | POA: Diagnosis not present

## 2018-12-01 DIAGNOSIS — F0150 Vascular dementia without behavioral disturbance: Secondary | ICD-10-CM | POA: Diagnosis not present

## 2018-12-01 DIAGNOSIS — F0390 Unspecified dementia without behavioral disturbance: Secondary | ICD-10-CM | POA: Diagnosis not present

## 2018-12-01 DIAGNOSIS — R2689 Other abnormalities of gait and mobility: Secondary | ICD-10-CM | POA: Diagnosis not present

## 2018-12-01 DIAGNOSIS — E119 Type 2 diabetes mellitus without complications: Secondary | ICD-10-CM | POA: Diagnosis not present

## 2018-12-02 DIAGNOSIS — F0150 Vascular dementia without behavioral disturbance: Secondary | ICD-10-CM | POA: Diagnosis not present

## 2018-12-02 DIAGNOSIS — R1312 Dysphagia, oropharyngeal phase: Secondary | ICD-10-CM | POA: Diagnosis not present

## 2018-12-02 DIAGNOSIS — F0390 Unspecified dementia without behavioral disturbance: Secondary | ICD-10-CM | POA: Diagnosis not present

## 2018-12-02 DIAGNOSIS — R2689 Other abnormalities of gait and mobility: Secondary | ICD-10-CM | POA: Diagnosis not present

## 2018-12-02 DIAGNOSIS — E119 Type 2 diabetes mellitus without complications: Secondary | ICD-10-CM | POA: Diagnosis not present

## 2018-12-05 DIAGNOSIS — R1312 Dysphagia, oropharyngeal phase: Secondary | ICD-10-CM | POA: Diagnosis not present

## 2018-12-05 DIAGNOSIS — E119 Type 2 diabetes mellitus without complications: Secondary | ICD-10-CM | POA: Diagnosis not present

## 2018-12-05 DIAGNOSIS — F0390 Unspecified dementia without behavioral disturbance: Secondary | ICD-10-CM | POA: Diagnosis not present

## 2018-12-05 DIAGNOSIS — F0150 Vascular dementia without behavioral disturbance: Secondary | ICD-10-CM | POA: Diagnosis not present

## 2018-12-05 DIAGNOSIS — R2689 Other abnormalities of gait and mobility: Secondary | ICD-10-CM | POA: Diagnosis not present

## 2018-12-15 DIAGNOSIS — R319 Hematuria, unspecified: Secondary | ICD-10-CM | POA: Diagnosis not present

## 2018-12-15 DIAGNOSIS — N390 Urinary tract infection, site not specified: Secondary | ICD-10-CM | POA: Diagnosis not present

## 2019-02-08 DIAGNOSIS — N390 Urinary tract infection, site not specified: Secondary | ICD-10-CM | POA: Diagnosis not present

## 2019-02-08 DIAGNOSIS — R319 Hematuria, unspecified: Secondary | ICD-10-CM | POA: Diagnosis not present

## 2019-02-13 ENCOUNTER — Emergency Department (HOSPITAL_COMMUNITY): Payer: Medicare Other

## 2019-02-13 ENCOUNTER — Other Ambulatory Visit: Payer: Self-pay

## 2019-02-13 ENCOUNTER — Encounter (HOSPITAL_COMMUNITY): Payer: Self-pay | Admitting: Pharmacy Technician

## 2019-02-13 ENCOUNTER — Inpatient Hospital Stay (HOSPITAL_COMMUNITY)
Admission: EM | Admit: 2019-02-13 | Discharge: 2019-02-17 | DRG: 062 | Disposition: A | Discharge status: Skilled Nursing Facility | Payer: Medicare Other | Source: Skilled Nursing Facility | Attending: Neurology | Admitting: Neurology

## 2019-02-13 DIAGNOSIS — E119 Type 2 diabetes mellitus without complications: Secondary | ICD-10-CM

## 2019-02-13 DIAGNOSIS — E785 Hyperlipidemia, unspecified: Secondary | ICD-10-CM | POA: Diagnosis present

## 2019-02-13 DIAGNOSIS — G8191 Hemiplegia, unspecified affecting right dominant side: Secondary | ICD-10-CM | POA: Diagnosis present

## 2019-02-13 DIAGNOSIS — F0280 Dementia in other diseases classified elsewhere without behavioral disturbance: Secondary | ICD-10-CM | POA: Diagnosis present

## 2019-02-13 DIAGNOSIS — R29810 Facial weakness: Secondary | ICD-10-CM | POA: Diagnosis present

## 2019-02-13 DIAGNOSIS — R29715 NIHSS score 15: Secondary | ICD-10-CM | POA: Diagnosis present

## 2019-02-13 DIAGNOSIS — F0281 Dementia in other diseases classified elsewhere with behavioral disturbance: Secondary | ICD-10-CM | POA: Diagnosis not present

## 2019-02-13 DIAGNOSIS — R1310 Dysphagia, unspecified: Secondary | ICD-10-CM | POA: Diagnosis present

## 2019-02-13 DIAGNOSIS — I680 Cerebral amyloid angiopathy: Secondary | ICD-10-CM | POA: Diagnosis present

## 2019-02-13 DIAGNOSIS — F0151 Vascular dementia with behavioral disturbance: Secondary | ICD-10-CM | POA: Diagnosis not present

## 2019-02-13 DIAGNOSIS — M899 Disorder of bone, unspecified: Secondary | ICD-10-CM | POA: Diagnosis present

## 2019-02-13 DIAGNOSIS — I10 Essential (primary) hypertension: Secondary | ICD-10-CM | POA: Diagnosis present

## 2019-02-13 DIAGNOSIS — F0150 Vascular dementia without behavioral disturbance: Secondary | ICD-10-CM | POA: Diagnosis present

## 2019-02-13 DIAGNOSIS — G309 Alzheimer's disease, unspecified: Secondary | ICD-10-CM | POA: Diagnosis present

## 2019-02-13 DIAGNOSIS — G301 Alzheimer's disease with late onset: Secondary | ICD-10-CM | POA: Diagnosis not present

## 2019-02-13 DIAGNOSIS — I6340 Cerebral infarction due to embolism of unspecified cerebral artery: Principal | ICD-10-CM | POA: Diagnosis present

## 2019-02-13 DIAGNOSIS — I639 Cerebral infarction, unspecified: Secondary | ICD-10-CM | POA: Diagnosis not present

## 2019-02-13 DIAGNOSIS — I69391 Dysphagia following cerebral infarction: Secondary | ICD-10-CM

## 2019-02-13 DIAGNOSIS — R4701 Aphasia: Secondary | ICD-10-CM | POA: Diagnosis present

## 2019-02-13 DIAGNOSIS — Z803 Family history of malignant neoplasm of breast: Secondary | ICD-10-CM

## 2019-02-13 DIAGNOSIS — E1165 Type 2 diabetes mellitus with hyperglycemia: Secondary | ICD-10-CM | POA: Diagnosis not present

## 2019-02-13 DIAGNOSIS — D72829 Elevated white blood cell count, unspecified: Secondary | ICD-10-CM

## 2019-02-13 DIAGNOSIS — R471 Dysarthria and anarthria: Secondary | ICD-10-CM | POA: Diagnosis present

## 2019-02-13 DIAGNOSIS — E876 Hypokalemia: Secondary | ICD-10-CM | POA: Diagnosis present

## 2019-02-13 DIAGNOSIS — E1151 Type 2 diabetes mellitus with diabetic peripheral angiopathy without gangrene: Secondary | ICD-10-CM | POA: Diagnosis present

## 2019-02-13 DIAGNOSIS — I739 Peripheral vascular disease, unspecified: Secondary | ICD-10-CM | POA: Diagnosis not present

## 2019-02-13 DIAGNOSIS — Z681 Body mass index (BMI) 19 or less, adult: Secondary | ICD-10-CM

## 2019-02-13 DIAGNOSIS — E854 Organ-limited amyloidosis: Secondary | ICD-10-CM | POA: Diagnosis not present

## 2019-02-13 DIAGNOSIS — R64 Cachexia: Secondary | ICD-10-CM | POA: Diagnosis present

## 2019-02-13 DIAGNOSIS — Z880 Allergy status to penicillin: Secondary | ICD-10-CM

## 2019-02-13 DIAGNOSIS — Z8249 Family history of ischemic heart disease and other diseases of the circulatory system: Secondary | ICD-10-CM

## 2019-02-13 DIAGNOSIS — I161 Hypertensive emergency: Secondary | ICD-10-CM | POA: Diagnosis present

## 2019-02-13 DIAGNOSIS — Z79899 Other long term (current) drug therapy: Secondary | ICD-10-CM | POA: Diagnosis not present

## 2019-02-13 DIAGNOSIS — E1159 Type 2 diabetes mellitus with other circulatory complications: Secondary | ICD-10-CM | POA: Diagnosis not present

## 2019-02-13 LAB — CBC
HCT: 37.3 % (ref 36.0–46.0)
Hemoglobin: 12.1 g/dL (ref 12.0–15.0)
MCH: 28.3 pg (ref 26.0–34.0)
MCHC: 32.4 g/dL (ref 30.0–36.0)
MCV: 87.1 fL (ref 80.0–100.0)
Platelets: 328 10*3/uL (ref 150–400)
RBC: 4.28 MIL/uL (ref 3.87–5.11)
RDW: 12.9 % (ref 11.5–15.5)
WBC: 13.9 10*3/uL — ABNORMAL HIGH (ref 4.0–10.5)
nRBC: 0 % (ref 0.0–0.2)

## 2019-02-13 LAB — COMPREHENSIVE METABOLIC PANEL
ALT: 8 U/L (ref 0–44)
AST: 16 U/L (ref 15–41)
Albumin: 3.6 g/dL (ref 3.5–5.0)
Alkaline Phosphatase: 97 U/L (ref 38–126)
Anion gap: 10 (ref 5–15)
BUN: 19 mg/dL (ref 8–23)
CO2: 29 mmol/L (ref 22–32)
Calcium: 9.4 mg/dL (ref 8.9–10.3)
Chloride: 99 mmol/L (ref 98–111)
Creatinine, Ser: 1.04 mg/dL — ABNORMAL HIGH (ref 0.44–1.00)
GFR calc Af Amer: 56 mL/min — ABNORMAL LOW (ref >60)
GFR calc non Af Amer: 48 mL/min — ABNORMAL LOW (ref >60)
Glucose, Bld: 250 mg/dL — ABNORMAL HIGH (ref 70–99)
Potassium: 3 mmol/L — ABNORMAL LOW (ref 3.5–5.1)
Sodium: 138 mmol/L (ref 135–145)
Total Bilirubin: 0.5 mg/dL (ref 0.3–1.2)
Total Protein: 6.6 g/dL (ref 6.5–8.1)

## 2019-02-13 LAB — DIFFERENTIAL
Abs Immature Granulocytes: 0.07 10*3/uL (ref 0.00–0.07)
Basophils Absolute: 0.1 10*3/uL (ref 0.0–0.1)
Basophils Relative: 0 %
Eosinophils Absolute: 0.1 10*3/uL (ref 0.0–0.5)
Eosinophils Relative: 1 %
Immature Granulocytes: 1 %
Lymphocytes Relative: 27 %
Lymphs Abs: 3.8 10*3/uL (ref 0.7–4.0)
Monocytes Absolute: 0.8 10*3/uL (ref 0.1–1.0)
Monocytes Relative: 5 %
Neutro Abs: 9.2 10*3/uL — ABNORMAL HIGH (ref 1.7–7.7)
Neutrophils Relative %: 66 %

## 2019-02-13 LAB — PROTIME-INR
INR: 1.2 (ref 0.8–1.2)
Prothrombin Time: 14.9 seconds (ref 11.4–15.2)

## 2019-02-13 LAB — I-STAT CREATININE, ED: Creatinine, Ser: 0.9 mg/dL (ref 0.44–1.00)

## 2019-02-13 LAB — APTT: aPTT: 28 seconds (ref 24–36)

## 2019-02-13 LAB — CBG MONITORING, ED: Glucose-Capillary: 229 mg/dL — ABNORMAL HIGH (ref 70–99)

## 2019-02-13 LAB — GLUCOSE, CAPILLARY: Glucose-Capillary: 225 mg/dL — ABNORMAL HIGH (ref 70–99)

## 2019-02-13 LAB — MRSA PCR SCREENING: MRSA by PCR: NEGATIVE

## 2019-02-13 MED ORDER — SODIUM CHLORIDE 0.9 % IV SOLN
50.0000 mL | Freq: Once | INTRAVENOUS | Status: AC
  Administered 2019-02-13: 22:00:00 50 mL via INTRAVENOUS

## 2019-02-13 MED ORDER — ACETAMINOPHEN 650 MG RE SUPP: 650.0000 mg | RECTAL | Status: DC | PRN

## 2019-02-13 MED ORDER — ACETAMINOPHEN 325 MG PO TABS: 650.0000 mg | ORAL_TABLET | ORAL | Status: DC | PRN

## 2019-02-13 MED ORDER — ALTEPLASE (STROKE) FULL DOSE INFUSION
0.9000 mg/kg | Freq: Once | INTRAVENOUS | Status: AC
  Administered 2019-02-13: 44.2 mg via INTRAVENOUS
  Filled 2019-02-13: qty 100

## 2019-02-13 MED ORDER — ORAL CARE MOUTH RINSE
15.0000 mL | Freq: Two times a day (BID) | OROMUCOSAL | Status: DC
  Administered 2019-02-13 – 2019-02-17 (×8): 15 mL via OROMUCOSAL

## 2019-02-13 MED ORDER — LABETALOL HCL 5 MG/ML IV SOLN
20.0000 mg | Freq: Once | INTRAVENOUS | Status: DC
  Filled 2019-02-13: qty 4

## 2019-02-13 MED ORDER — INSULIN ASPART 100 UNIT/ML ~~LOC~~ SOLN
0.0000 [IU] | Freq: Three times a day (TID) | SUBCUTANEOUS | Status: DC
  Administered 2019-02-14: 1 [IU] via SUBCUTANEOUS
  Administered 2019-02-14: 3 [IU] via SUBCUTANEOUS
  Administered 2019-02-15 (×2): 1 [IU] via SUBCUTANEOUS
  Administered 2019-02-15 – 2019-02-17 (×4): 2 [IU] via SUBCUTANEOUS

## 2019-02-13 MED ORDER — SODIUM CHLORIDE 0.9 % IV SOLN
INTRAVENOUS | Status: DC
  Administered 2019-02-14 – 2019-02-17 (×3): via INTRAVENOUS

## 2019-02-13 MED ORDER — SODIUM CHLORIDE 0.9% FLUSH
3.0000 mL | Freq: Once | INTRAVENOUS | Status: DC
Start: 2019-02-13 — End: 2019-02-14

## 2019-02-13 MED ORDER — STROKE: EARLY STAGES OF RECOVERY BOOK: Freq: Once | Status: DC

## 2019-02-13 MED ORDER — SENNOSIDES-DOCUSATE SODIUM 8.6-50 MG PO TABS: 1.0000 | ORAL_TABLET | Freq: Every evening | ORAL | Status: DC | PRN

## 2019-02-13 MED ORDER — LABETALOL HCL 5 MG/ML IV SOLN
INTRAVENOUS | Status: AC
  Administered 2019-02-13: 21:00:00
  Filled 2019-02-13: qty 4

## 2019-02-13 MED ORDER — PANTOPRAZOLE SODIUM 40 MG IV SOLR
40.0000 mg | Freq: Every day | INTRAVENOUS | Status: DC
  Administered 2019-02-13: 40 mg via INTRAVENOUS
  Filled 2019-02-13: qty 40

## 2019-02-13 MED ORDER — IOHEXOL 350 MG/ML SOLN
100.0000 mL | Freq: Once | INTRAVENOUS | Status: AC | PRN
  Administered 2019-02-13: 100 mL via INTRAVENOUS

## 2019-02-13 MED ORDER — ACETAMINOPHEN 160 MG/5ML PO SOLN: 650.0000 mg | ORAL | Status: DC | PRN

## 2019-02-13 MED ORDER — CLEVIDIPINE BUTYRATE 0.5 MG/ML IV EMUL
0.0000 mg/h | INTRAVENOUS | Status: DC
  Administered 2019-02-13: 6 mg/h via INTRAVENOUS
  Administered 2019-02-13: 21:00:00 1 mg/h via INTRAVENOUS
  Administered 2019-02-14: 5 mg/h via INTRAVENOUS
  Administered 2019-02-14: 15:00:00 2 mg/h via INTRAVENOUS
  Filled 2019-02-13 (×3): qty 50

## 2019-02-13 NOTE — Code Documentation (Addendum)
Responded to Code stroke called at 2028.   Pt arrived at 2038 with aphasia and R facial droop. [id number]. CBG-88, NIH-15. CT-negative for hemorrhage. CTA/CTP-no LVO.  After giving 10mg  IV labetolol and starting cleviprex gtt, IV TPA started at 2110 and pt transported to 4N22.

## 2019-02-13 NOTE — H&P (Addendum)
Neurology Consultation  CC: Code stroke for right facial droop, confusion  History is obtained from: Chart, EMS  HPI: Anne Hernandez is a 86 y.o. female past medical history of dementia, hypertension, hyperlipidemia, diabetes, lives in a assisted living facility, where she was last normal seen at 6:45 PM when she was given a bath today 02/13/2019.  At some point after that, she was found slumped in the chair, with right facial droop and inability to follow commands. She was brought in as an acute code stroke and seen in the emergency department.  She is not able to provide any history. Noncontrast CT of the head was done was unremarkable for any acute changes.  Showed chronic accelerated white matter disease and atrophy. No evidence of bleed. No family at bedside. Given the patient's history provided by the facility, current presentation consistent with acute code stroke, no contraindications known for IV TPA per EMS-IV TPA was administered.  LKW: 6:45 PM on 02/13/2019 tpa given?: yes Premorbid modified Rankin scale (mRS): 4  ROS: ROS was performed and is negative except as noted in the HPI.    Past Medical History:  Diagnosis Date  . Alzheimer's disease (HCC)   . Anemia   . Cellulitis and abscess of trunk   . Disorder of bone and cartilage, unspecified   . Hyperlipidemia   . Hypertension   . Leukocytosis, unspecified   . Muscle weakness (generalized)   . Other abnormal glucose   . Pain in joint, lower leg   . Peripheral vascular disease, unspecified (HCC)   . Senile dementia, uncomplicated (HCC)   . Type II or unspecified type diabetes mellitus without mention of complication, not stated as uncontrolled    Family History  Problem Relation Age of Onset  . Cancer Mother 64       Breast Cancer  . Heart disease Father 30       CHF  . Heart disease Brother        MI   Social History:   reports that she has never smoked. She has never used smokeless tobacco. She reports  current alcohol use of about 1.0 standard drinks of alcohol per week. She reports that she does not use drugs.  Medications  Current Facility-Administered Medications:  .   stroke: mapping our early stages of recovery book, , Does not apply, Once, Milon Dikes, MD .  alteplase (ACTIVASE) 1 mg/mL infusion 44.2 mg, 0.9 mg/kg, Intravenous, Once, Last Rate: 44.2 mL/hr at 02/13/19 2110, 44.2 mg at 02/13/19 2110 **FOLLOWED BY** 0.9 %  sodium chloride infusion, 50 mL, Intravenous, Once, Milon Dikes, MD .  0.9 %  sodium chloride infusion, , Intravenous, Continuous, Milon Dikes, MD .  acetaminophen (TYLENOL) tablet 650 mg, 650 mg, Oral, Q4H PRN **OR** acetaminophen (TYLENOL) solution 650 mg, 650 mg, Per Tube, Q4H PRN **OR** acetaminophen (TYLENOL) suppository 650 mg, 650 mg, Rectal, Q4H PRN, Milon Dikes, MD .  labetalol (NORMODYNE,TRANDATE) injection 20 mg, 20 mg, Intravenous, Once **AND** clevidipine (CLEVIPREX) infusion 0.5 mg/mL, 0-21 mg/hr, Intravenous, Continuous, Milon Dikes, MD, Last Rate: 24 mL/hr at 02/13/19 2135, 12 mg/hr at 02/13/19 2135 .  pantoprazole (PROTONIX) injection 40 mg, 40 mg, Intravenous, QHS, Milon Dikes, MD .  senna-docusate (Senokot-S) tablet 1 tablet, 1 tablet, Oral, QHS PRN, Milon Dikes, MD .  sodium chloride flush (NS) 0.9 % injection 3 mL, 3 mL, Intravenous, Once, Alvira Monday, MD No current outpatient medications on file.  Exam: Current vital signs: BP (!) 139/54   Pulse  65   Temp 97.7 F (36.5 C) (Oral)   Resp 17   Wt 49.1 kg   SpO2 97%   BMI 18.58 kg/m  Vital signs in last 24 hours: Temp:  [97.7 F (36.5 C)] 97.7 F (36.5 C) (04/06 2055) Pulse Rate:  [56-65] 65 (04/06 2130) Resp:  [17-20] 17 (04/06 2130) BP: (139-208)/(54-98) 139/54 (04/06 2130) SpO2:  [92 %-98 %] 97 % (04/06 2130) Weight:  [49.1 kg] 49.1 kg (04/06 2000) Gen: WD WN NAD HEENT: [place] AT MMM CVS: S1S2+, RRR, no m/r/g RESP: CTABL, some upper airway gurgling. Neurological  exam Patient is awake, alert nonverbal She does not follow any commands Completely nonverbal but moaning to pain. Cranial nerves: Pupils are equal round reactive light, extraocular movements do not seem to be restricted and she is able to tend to right and left, she blinks to threat inconsistently from both sides, she has a obvious right lower facial droop, unable to assess auditory acuity.  Unable to assess shoulder shrug.  Does not follow commands to stick tongue out. Motor exam: No drift in any of the upper extremities but she is unable to lift both her lower extremities against gravity. Unable to perform coordination testing Sensory exam: Grimaces to noxious stimulation equally on all extremities. Gait testing deferred at this time NIH stroke scale performed with the patient's RN documented below-total 15 Interval: Initial (04/06 2056) Level of Consciousness (1a.)   : Alert, keenly responsive (04/06 2056) LOC Questions (1b. )   +: Answers neither question correctly (04/06 2056) LOC Commands (1c. )   + : Performs neither task correctly (04/06 2056) Best Gaze (2. )  +: Normal (04/06 2056) Visual (3. )  +: No visual loss (04/06 2056) Facial Palsy (4. )    : Partial paralysis  (04/06 2056) Motor Arm, Left (5a. )   +: No drift (04/06 2056) Motor Arm, Right (5b. )   +: No drift (04/06 2056) Motor Leg, Left (6a. )   +: Some effort against gravity (04/06 2056) Motor Leg, Right (6b. )   +: Some effort against gravity (04/06 2056) Limb Ataxia (7. ): Absent (04/06 2056) Sensory (8. )   +: Normal, no sensory loss (04/06 2056) Best Language (9. )   +: Mute, global aphasia (04/06 2056) Dysarthria (10. ): Severe dysarthria, patient's speech is so slurred as to be unintelligible in the absence of or out of proportion to any dysphasia, or is mute/anarthric (04/06 2056) Extinction/Inattention (11.)   +: No Abnormality (04/06 2056) Modified SS Total  +: 11 (04/06 2056) Complete NIHSS TOTAL: 15 (04/06  2056)  Labs I have reviewed labs in epic and the results pertinent to this consultation are:  CBC    Component Value Date/Time   WBC 13.9 (H) 02/13/2019 2046   RBC 4.28 02/13/2019 2046   HGB 12.1 02/13/2019 2046   HCT 37.3 02/13/2019 2046   PLT 328 02/13/2019 2046   MCV 87.1 02/13/2019 2046   MCH 28.3 02/13/2019 2046   MCHC 32.4 02/13/2019 2046   RDW 12.9 02/13/2019 2046   RDW 13.4 12/01/2013 0853   LYMPHSABS 3.8 02/13/2019 2046   LYMPHSABS 3.1 12/01/2013 0853   MONOABS 0.8 02/13/2019 2046   EOSABS 0.1 02/13/2019 2046   EOSABS 0.1 12/01/2013 0853   BASOSABS 0.1 02/13/2019 2046   BASOSABS 0.0 12/01/2013 0853   CMP     Component Value Date/Time   NA 138 02/13/2019 2046   NA 139 04/12/2014 0843   K  3.0 (L) 02/13/2019 2046   CL 99 02/13/2019 2046   CO2 29 02/13/2019 2046   GLUCOSE 250 (H) 02/13/2019 2046   BUN 19 02/13/2019 2046   BUN 12 04/12/2014 0843   CREATININE 1.04 (H) 02/13/2019 2046   CALCIUM 9.4 02/13/2019 2046   PROT 6.6 02/13/2019 2046   PROT 6.4 04/12/2014 0843   ALBUMIN 3.6 02/13/2019 2046   ALBUMIN 4.4 04/12/2014 0843   AST 16 02/13/2019 2046   ALT 8 02/13/2019 2046   ALKPHOS 97 02/13/2019 2046   BILITOT 0.5 02/13/2019 2046   GFRNONAA 48 (L) 02/13/2019 2046   GFRAA 56 (L) 02/13/2019 2046   Imaging I have reviewed the images obtained: CT-scan of the brain-atrophy, chronic white matter disease.  No bleed.  Aspects 10. CTA head and neck-no LVO.  Extensive intracranial atherosclerosis.  No perfusion deficit  Assessment: 85 year old past history of dementia hypertension hyperlipidemia diabetes brought in from assisted living facility with sudden onset of aphasia and right-sided facial droop. NIH stroke scale 15. No bleed on CT.  No contraindications for IV TPA.  IV TPA given. CTA head and neck with no evidence of LVO Likely acute ischemic stroke from cardioembolic or atheroembolic etiology. Also possible that she has a component of hypertensive  emergency as well as underlying toxic metabolic encephalopathy due to the elevated white count.  No prior history or preceding history of sickness or illness makes stroke likely and being within the window, made her a candidate for IV TPA.  Impression: Acute ischemic stroke-etiology no investigation Evaluate for toxic metabolic encephalopathy Hypertensive emergency  Recommendations: -Admit neurological ICU -Post TPA neurochecks -Telemetry monitoring -Blood pressure parameters per post TPA protocol-use Cleviprex drip. -MRI brain without contrast -Echocardiogram -HgbA1c, fasting lipid panel -Frequent neuro checks -No antiplatelets or anticoagulation for 24 hours after TPA. -Atorvastatin 80 mg PO daily -PT consult, OT consult, Speech consult -If Afib found on telemetry, will need anticoagulation. Decision pending imaging and stroke team rounding.  -Fingersticks and  sliding scale insulin -Mild leukocytosis-check UA, chest x-ray-no antibiotics for now.  May be possible aspiration pneumonia or UTI.  Will treat based on results -Check labs in the morning -Replete electrolytes as necessary    CODE STATUS - HPOA (sister) confirmed she is DNR   Present on arrival -Acute ischemic stroke -Hemiplegia/hemiparesis - Possible aspiration pneumonia  Delays in IV TPA: - Difficulty with obtaining history due to patient's aphasia. - Blood pressure control.  Needed 1 dose of IV labetalol for TPA to be started.  Following TPA bolus administration, had spikes in blood pressure again that required Cleviprex up to 21 as well as an extra dose of hydralazine to bring blood pressure down to goal.  I spoke over the phone with patient's sister-Anne Hernandez and updated her on the presentation, treatment with IV TPA and further course of action.  She was given the number for the 4 N. ICU unit and asked to call back tomorrow to get updates.  -- Milon Dikes, MD [facility] Pager:  [phone] If 7pm to 7am, please call on call as listed on AMION.  CRITICAL CARE ATTESTATION Performed by: Milon Dikes, MD Total critical care time: 45 minutes Critical care time was exclusive of separately billable procedures and treating other patients and/or supervising APPs/Residents/Students Critical care was necessary to treat or prevent imminent or life-threatening deterioration due to hypertensive emergency, acute ischemic stroke This patient is critically ill and at significant risk for neurological worsening and/or death and care requires constant monitoring. Critical  care was time spent personally by me on the following activities: development of treatment plan with patient and/or surrogate as well as nursing, discussions with consultants, evaluation of patient's response to treatment, examination of patient, obtaining history from patient or surrogate, ordering and performing treatments and interventions, ordering and review of laboratory studies, ordering and review of radiographic studies, pulse oximetry, re-evaluation of patient's condition, participation in multidisciplinary rounds and medical decision making of high complexity in the care of this patient.

## 2019-02-13 NOTE — ED Provider Notes (Signed)
[facility] EMERGENCY DEPARTMENT Provider Note   CSN: [id number] Arrival date & time: 02/13/19  2039  An emergency department physician performed an initial assessment on this suspected stroke patient at 2040.  History   Chief Complaint Chief Complaint  Patient presents with  . Code Stroke    HPI Anne Hernandez is a 85 y.o. female.     HPI Presents from SNF for R sided facial droop and new aphasia. From facility with [facility] 6:45PM with nurse who gave patient a bath.  Normally will respond by name, will not speak at this time.  VSS with EMS.  Past Medical History:  Diagnosis Date  . Alzheimer's disease (HCC)   . Anemia   . Cellulitis and abscess of trunk   . Disorder of bone and cartilage, unspecified   . Hyperlipidemia   . Hypertension   . Leukocytosis, unspecified   . Muscle weakness (generalized)   . Other abnormal glucose   . Pain in joint, lower leg   . Peripheral vascular disease, unspecified (HCC)   . Senile dementia, uncomplicated (HCC)   . Type II or unspecified type diabetes mellitus without mention of complication, not stated as uncontrolled     Patient Active Problem List   Diagnosis Date Noted  . Acute ischemic stroke (HCC) 02/13/2019  . Mixed Alzheimer's and vascular dementia (HCC) 12/13/2013  . Folliculitis 12/13/2013  . Alzheimer's disease (HCC) 01/24/2013  . Hyperlipidemia 01/24/2013  . Peripheral vascular disease, unspecified (HCC) 01/24/2013  . Essential hypertension, benign 01/24/2013  . Pain in joint, lower leg 01/24/2013  . Disorder of bone and cartilage, unspecified 01/24/2013  . Diabetes (HCC) 01/24/2013  . Pruritic dermatitis 01/24/2013    Past Surgical History:  Procedure Laterality Date  . OOPHORECTOMY    . TONSILLECTOMY       OB History   No obstetric history on file.      Home Medications    Prior to Admission medications   Not on File    Family History Family History  Problem Relation Age of  Onset  . Cancer Mother 46       Breast Cancer  . Heart disease Father 93       CHF  . Heart disease Brother        MI    Social History Social History   Tobacco Use  . Smoking status: Never Smoker  . Smokeless tobacco: Never Used  Substance Use Topics  . Alcohol use: Yes    Alcohol/week: 1.0 standard drinks    Types: 1 drink(s) per week    Comment: Rare  . Drug use: No     Allergies   Penicillins   Review of Systems Review of Systems  Unable to perform ROS: Mental status change     Physical Exam Updated Vital Signs BP (!) 208/98   Temp 97.7 F (36.5 C) (Oral)   Wt 49.1 kg   BMI 18.58 kg/m   Physical Exam Vitals signs and nursing note reviewed.  Constitutional:      Appearance: She is well-developed. She is ill-appearing.  HENT:     Head: Normocephalic and atraumatic.     Nose: No congestion or rhinorrhea.  Eyes:     Conjunctiva/sclera: Conjunctivae normal.  Neck:     Musculoskeletal: Neck supple.  Cardiovascular:     Rate and Rhythm: Normal rate and regular rhythm.     Heart sounds: No murmur.  Pulmonary:     Effort: Pulmonary effort is normal.  No respiratory distress.     Breath sounds: Normal breath sounds.  Abdominal:     Palpations: Abdomen is soft.     Tenderness: There is no abdominal tenderness.  Musculoskeletal:     Right lower leg: No edema.     Left lower leg: No edema.  Skin:    General: Skin is warm and dry.  Neurological:     Mental Status: She is alert.     Comments: R sided facial droop.  Moans to pain but no other verbal response.  Eyes open.  Will hold BUEs to antigravity.  Some movement of bilateral lower extremities, equal.  PERL.       ED Treatments / Results  Labs (all labs ordered are listed, but only abnormal results are displayed) Labs Reviewed  CBC - Abnormal; Notable for the following components:      Result Value   WBC 13.9 (*)    All other components within normal limits  DIFFERENTIAL - Abnormal; Notable  for the following components:   Neutro Abs 9.2 (*)    All other components within normal limits  CBG MONITORING, ED - Abnormal; Notable for the following components:   Glucose-Capillary 229 (*)    All other components within normal limits  PROTIME-INR  APTT  COMPREHENSIVE METABOLIC PANEL  HEMOGLOBIN A1C  LIPID PANEL  I-STAT CREATININE, ED  CBG MONITORING, ED    EKG None  Radiology Ct Head Code Stroke Wo Contrast  Result Date: 02/13/2019 CLINICAL DATA:  Code stroke.  Right facial droop EXAM: CT HEAD WITHOUT CONTRAST TECHNIQUE: Contiguous axial images were obtained from the base of the skull through the vertex without intravenous contrast. COMPARISON:  None. FINDINGS: Brain: There is no mass, hemorrhage or extra-axial collection. There is generalized atrophy without lobar predilection. Hypodensity of the white matter is most commonly associated with chronic microvascular disease. Old bilateral basal ganglia lacunar infarcts. Is an old right occipital lobe infarct. Vascular: No abnormal hyperdensity of the major intracranial arteries or dural venous sinuses. No intracranial atherosclerosis. Skull: The visualized skull base, calvarium and extracranial soft tissues are normal. Sinuses/Orbits: Complete opacification of the left maxillary sinus. The orbits are normal. IMPRESSION: 1. No acute intracranial hemorrhage. 2. Atrophy and advanced chronic microvascular disease. 3. ASPECTS is 10. These results were communicated to Dr. Milon Dikes at 9:00 pm on 02/13/2019 by text page via the [facility] messaging system. Electronically Signed   By: Deatra Robinson M.D.   On: 02/13/2019 21:02    Procedures Procedures (including critical care time)  Medications Ordered in ED Medications  sodium chloride flush (NS) 0.9 % injection 3 mL (has no administration in time range)  alteplase (ACTIVASE) 1 mg/mL infusion 44.2 mg (has no administration in time range)    Followed by  0.9 %  sodium chloride infusion (has no  administration in time range)   stroke: mapping our early stages of recovery book (has no administration in time range)  0.9 %  sodium chloride infusion (has no administration in time range)  acetaminophen (TYLENOL) tablet 650 mg (has no administration in time range)    Or  acetaminophen (TYLENOL) solution 650 mg (has no administration in time range)    Or  acetaminophen (TYLENOL) suppository 650 mg (has no administration in time range)  senna-docusate (Senokot-S) tablet 1 tablet (has no administration in time range)  pantoprazole (PROTONIX) injection 40 mg (has no administration in time range)  labetalol (NORMODYNE,TRANDATE) injection 20 mg (has no administration in time range)  And  clevidipine (CLEVIPREX) infusion 0.5 mg/mL (has no administration in time range)  iohexol (OMNIPAQUE) 350 MG/ML injection 100 mL (100 mLs Intravenous Contrast Given 02/13/19 2106)  labetalol (NORMODYNE,TRANDATE) 5 MG/ML injection (  Given 02/13/19 2108)     Initial Impression / Assessment and Plan / ED Course  I have reviewed the triage vital signs and the nursing notes.  Pertinent labs & imaging results that were available during my care of the patient were reviewed by me and considered in my medical decision making (see chart for details).        Patient presents for code stroke.  CT head without acute changes so TPA given as no contraindications found.  Neuro attending at bedside.  BP goal < .  Given TPA without complication. Taken immediately to the ICU for further management.   Final Clinical Impressions(s) / ED Diagnoses   Final diagnoses:  Ischemic stroke [facility])    ED Discharge Orders    None       Ina Kick, MD 02/13/19 0981    Alvira Monday, MD 02/18/19 1359

## 2019-02-13 NOTE — ED Triage Notes (Signed)
Pt arrives via ems from [facility] 1845. Pt found at 2005 sitting in her chair with her wheelchair in front of her and she was slumped over. Pt with R sided facial droop and aphasic on arrival. 97.7, 190/90, HR 58, 96% RA.

## 2019-02-13 NOTE — Progress Notes (Signed)
Pharmacist Code Stroke Response  Notified to mix tPA at 2102 by Dr. Wilford Corner Delivered tPA to RN at 2107  tPA dose = 4.4mg  bolus over 1 minute followed by 39.8mg  for a total dose of 44.2mg  over 1 hour  Issues/delays encountered (if applicable): Blood pressure required treatment  Anne Hernandez, Anne Hernandez 02/13/19 9:09 PM

## 2019-02-14 ENCOUNTER — Inpatient Hospital Stay (HOSPITAL_COMMUNITY): Payer: Medicare Other

## 2019-02-14 DIAGNOSIS — G301 Alzheimer's disease with late onset: Secondary | ICD-10-CM

## 2019-02-14 DIAGNOSIS — E785 Hyperlipidemia, unspecified: Secondary | ICD-10-CM

## 2019-02-14 DIAGNOSIS — I639 Cerebral infarction, unspecified: Secondary | ICD-10-CM

## 2019-02-14 DIAGNOSIS — E1159 Type 2 diabetes mellitus with other circulatory complications: Secondary | ICD-10-CM

## 2019-02-14 DIAGNOSIS — I10 Essential (primary) hypertension: Secondary | ICD-10-CM

## 2019-02-14 DIAGNOSIS — F0281 Dementia in other diseases classified elsewhere with behavioral disturbance: Secondary | ICD-10-CM

## 2019-02-14 LAB — LIPID PANEL
Cholesterol: 258 mg/dL — ABNORMAL HIGH (ref 0–200)
HDL: 74 mg/dL (ref >40)
LDL Cholesterol: 166 mg/dL — ABNORMAL HIGH (ref 0–99)
Total CHOL/HDL Ratio: 3.5 RATIO
Triglycerides: 89 mg/dL (ref <150)
VLDL: 18 mg/dL (ref 0–40)

## 2019-02-14 LAB — GLUCOSE, CAPILLARY
Glucose-Capillary: 203 mg/dL — ABNORMAL HIGH (ref 70–99)
Glucose-Capillary: 120 mg/dL — ABNORMAL HIGH (ref 70–99)
Glucose-Capillary: 144 mg/dL — ABNORMAL HIGH (ref 70–99)
Glucose-Capillary: 124 mg/dL — ABNORMAL HIGH (ref 70–99)

## 2019-02-14 LAB — ECHOCARDIOGRAM COMPLETE
Height: 62 in
Weight: 1654.33 oz

## 2019-02-14 LAB — HEMOGLOBIN A1C
Hgb A1c MFr Bld: 8.3 % — ABNORMAL HIGH (ref 4.8–5.6)
Mean Plasma Glucose: 191.51 mg/dL

## 2019-02-14 MED ORDER — LORAZEPAM 2 MG/ML IJ SOLN
0.5000 mg | Freq: Once | INTRAMUSCULAR | Status: AC
  Administered 2019-02-14: 21:00:00 0.5 mg via INTRAVENOUS
  Filled 2019-02-14: qty 1

## 2019-02-14 MED ORDER — ATORVASTATIN CALCIUM 40 MG PO TABS
40.0000 mg | ORAL_TABLET | Freq: Every day | ORAL | Status: DC
  Administered 2019-02-16: 21:00:00 40 mg via ORAL
  Filled 2019-02-14 (×3): qty 1

## 2019-02-14 MED ORDER — ASPIRIN EC 81 MG PO TBEC
81.0000 mg | DELAYED_RELEASE_TABLET | Freq: Every day | ORAL | Status: DC
  Administered 2019-02-15 – 2019-02-17 (×3): 81 mg via ORAL
  Filled 2019-02-14 (×4): qty 1

## 2019-02-14 MED ORDER — PANTOPRAZOLE SODIUM 40 MG PO TBEC
40.0000 mg | DELAYED_RELEASE_TABLET | Freq: Every day | ORAL | Status: DC
  Administered 2019-02-14 – 2019-02-17 (×4): 40 mg via ORAL
  Filled 2019-02-14 (×4): qty 1

## 2019-02-14 MED ORDER — QUETIAPINE FUMARATE 25 MG PO TABS
12.5000 mg | ORAL_TABLET | ORAL | Status: AC
  Administered 2019-02-14: 20:00:00 12.5 mg via ORAL
  Filled 2019-02-14: qty 1

## 2019-02-14 MED ORDER — AMLODIPINE BESYLATE 10 MG PO TABS
10.0000 mg | ORAL_TABLET | Freq: Every day | ORAL | Status: DC
  Administered 2019-02-15 – 2019-02-17 (×3): 10 mg via ORAL
  Filled 2019-02-14 (×3): qty 1

## 2019-02-14 MED ORDER — LABETALOL HCL 5 MG/ML IV SOLN: 5.0000 mg | INTRAVENOUS | Status: DC | PRN

## 2019-02-14 MED ORDER — AMLODIPINE BESYLATE 5 MG PO TABS
5.0000 mg | ORAL_TABLET | Freq: Every day | ORAL | Status: DC
  Administered 2019-02-14: 5 mg via ORAL
  Filled 2019-02-14: qty 1

## 2019-02-14 MED ORDER — RESOURCE THICKENUP CLEAR PO POWD
ORAL | Status: DC | PRN
  Filled 2019-02-14: qty 125

## 2019-02-14 NOTE — Progress Notes (Signed)
STROKE TEAM PROGRESS NOTE   INTERVAL HISTORY Patient RN at bedside.  She just had 2D echo finished.  Has baseline dementia, not following commands.  Not orientated.  Has facial asymmetry on the right, but moving all extremities.  MRI brain pending.  Vitals:   02/14/19 0530 02/14/19 0600 02/14/19 0700 02/14/19 0800  BP: (!) 144/57 (!) 141/92 (!) 155/73   Pulse:  91 (!) 106   Resp: 16 (!) 22 19   Temp:    (!) 97.5 F (36.4 C)  TempSrc:    Axillary  SpO2:  96% 91%   Weight:      Height:        CBC:  Recent Labs  Lab 02/13/19 2046  WBC 13.9*  NEUTROABS 9.2*  HGB 12.1  HCT 37.3  MCV 87.1  PLT 328    Basic Metabolic Panel:  Recent Labs  Lab 02/13/19 2045 02/13/19 2046  NA  --  138  K  --  3.0*  CL  --  99  CO2  --  29  GLUCOSE  --  250*  BUN  --  19  CREATININE 0.90 1.04*  CALCIUM  --  9.4   Lipid Panel:     Component Value Date/Time   CHOL 258 (H) 02/14/2019 0302   CHOL 254 (H) 04/12/2014 0843   TRIG 89 02/14/2019 0302   HDL 74 02/14/2019 0302   HDL 93 04/12/2014 0843   CHOLHDL 3.5 02/14/2019 0302   VLDL 18 02/14/2019 0302   LDLCALC 166 (H) 02/14/2019 0302   LDLCALC 136 (H) 04/12/2014 0843   HgbA1c:  Lab Results  Component Value Date   HGBA1C 8.3 (H) 02/14/2019   Urine Drug Screen: No results found for: LABOPIA, COCAINSCRNUR, LABBENZ, AMPHETMU, THCU, LABBARB  Alcohol Level No results found for: ETH  IMAGING Ct Angio Head W Or Wo Contrast  Result Date: 02/13/2019 CLINICAL DATA:  Initial evaluation for acute speech difficulty. EXAM: CT ANGIOGRAPHY HEAD AND NECK CT PERFUSION BRAIN TECHNIQUE: Multidetector CT imaging of the head and neck was performed using the standard protocol during bolus administration of intravenous contrast. Multiplanar CT image reconstructions and MIPs were obtained to evaluate the vascular anatomy. Carotid stenosis measurements (when applicable) are obtained utilizing NASCET criteria, using the distal internal carotid diameter as the  denominator. Multiphase CT imaging of the brain was performed following IV bolus contrast injection. Subsequent parametric perfusion maps were calculated using RAPID software. CONTRAST:  OMNIPAQUE IOHEXOL 350 MG/ML SOLN COMPARISON:  Prior CT from earlier same day. FINDINGS: CTA NECK FINDINGS Aortic arch: Visualized aortic arch of normal caliber with normal 3 vessel morphology. Mild atherosclerotic change about the aortic arch. No hemodynamically significant stenosis about the origin of the great vessels. Visualized subclavian arteries widely patent. Right carotid system: Right common carotid artery patent from its origin to the bifurcation without stenosis. Mild mixed plaque about the right bifurcation without hemodynamically significant stenosis. Right ICA widely patent to the skull base without stenosis, dissection or occlusion. Left carotid system: Left common carotid artery patent from its origin to the bifurcation without stenosis. Mild calcified plaque about the left bifurcation without hemodynamically significant stenosis. Left ICA patent from the bifurcation to the skull base without stenosis, dissection or occlusion. Vertebral arteries: Both of the vertebral arteries are hypoplastic and arise from the subclavian arteries. Vertebral arteries attenuated but patent proximally, but essentially occludes by the distal V2/V3 segments bilaterally. Vertebral arteries are occluded by the skull base. This is suspected to be chronic  in nature. Skeleton: No acute osseous finding. No discrete lytic or blastic osseous lesions. Other neck: No other acute soft tissue abnormality within the neck. No adenopathy. Chronic left maxillary sinusitis noted. Upper chest: Layering secretions present within the subglottic trachea and proximal mainstem bronchi, greater on the right. Patient likely at risk for aspiration. Visualized upper chest demonstrates no other acute finding. Partially visualized lungs are largely clear.  Review of the MIP images confirms the above findings CTA HEAD FINDINGS Anterior circulation: Petrous segments patent bilaterally. Mild scattered atheromatous plaque within the cavernous/supraclinoid ICAs without high-grade stenosis. ICA termini well perfused. A1 segments patent bilaterally. Normal anterior communicating artery. Multifocal severe segmental stenoses seen within the proximal-mid A2 segments bilaterally without occlusion. ACAs are patent to their distal aspects. Left M1 patent without high-grade stenosis. Normal left MCA bifurcation. Severe proximal M2 stenoses involving both the superior and inferior divisions, with additional advanced atheromatous irregularity and stenoses throughout the left MCA branches. Right M1 patent proximally. Short-segment moderate mid-distal right M1 stenosis (series 11, image 14). Normal right MCA bifurcation. Advanced small vessel atheromatous irregularity throughout the right MCA branches distally. Posterior circulation: Vertebral arteries occluded at the skull base. Opacification of the distal V4 segments bilaterally favored to be retrograde in nature via the anterior circulation. Right vertebral artery appears to be dominant. Right PICA patent proximally. Distal left V4 segment faintly opacified with faint perfusion of the left PICA. Basilar diminutive with focal short-segment moderate stenosis at its proximal aspect (series 9, image 123). Basilar otherwise patent to its distal aspect without high-grade stenosis. Superior cerebral arteries patent bilaterally. Left PCA supplied via the basilar as well as a prominent left posterior communicating artery. Fetal type origin of the right PCA. Focal short-segment severe stenosis at the right posterior communicating artery noted (series 8, image 85). Multifocal segmental severe stenoses seen throughout the left P2 segment. Extensive distal small vessel atheromatous irregularity throughout the PCA branches distally. Venous  sinuses: Grossly patent, although not well assessed due to arterial timing the contrast bolus. Anatomic variants: Predominant fetal type origin of the PCAs with overall diminutive vertebrobasilar system. Delayed phase: Not performed. Review of the MIP images confirms the above findings CT Brain Perfusion Findings: CBF (<30%) Volume: 0mL Perfusion (Tmax>6.0s) volume: 0mL Mismatch Volume: 0mL Infarction Location:Negative. IMPRESSION: 1. Negative CTA for emergent large vessel occlusion. 2. No evidence for acute infarct by CT perfusion. 3. Hypoplastic vertebral arteries bilaterally, occluded within the neck. Opacification of the intracranial vertebrobasilar system likely retrograde in nature via collateral flow from the anterior circulation. 4. Extensive intracranial atherosclerotic changes above, with most notable findings including severe multifocal bilateral A2, proximal M2, and left P2 stenoses. These results were communicated to Wilford Corner at 9:19 pmon 4/6/2020by text page via the [facility] messaging system. Electronically Signed   By: Rise Mu M.D.   On: 02/13/2019 21:47   Ct Angio Neck W And/or Wo Contrast  Result Date: 02/13/2019 CLINICAL DATA:  Initial evaluation for acute speech difficulty. EXAM: CT ANGIOGRAPHY HEAD AND NECK CT PERFUSION BRAIN TECHNIQUE: Multidetector CT imaging of the head and neck was performed using the standard protocol during bolus administration of intravenous contrast. Multiplanar CT image reconstructions and MIPs were obtained to evaluate the vascular anatomy. Carotid stenosis measurements (when applicable) are obtained utilizing NASCET criteria, using the distal internal carotid diameter as the denominator. Multiphase CT imaging of the brain was performed following IV bolus contrast injection. Subsequent parametric perfusion maps were calculated using RAPID software. CONTRAST:  OMNIPAQUE IOHEXOL 350 MG/ML  SOLN COMPARISON:  Prior CT from earlier same day. FINDINGS: CTA NECK  FINDINGS Aortic arch: Visualized aortic arch of normal caliber with normal 3 vessel morphology. Mild atherosclerotic change about the aortic arch. No hemodynamically significant stenosis about the origin of the great vessels. Visualized subclavian arteries widely patent. Right carotid system: Right common carotid artery patent from its origin to the bifurcation without stenosis. Mild mixed plaque about the right bifurcation without hemodynamically significant stenosis. Right ICA widely patent to the skull base without stenosis, dissection or occlusion. Left carotid system: Left common carotid artery patent from its origin to the bifurcation without stenosis. Mild calcified plaque about the left bifurcation without hemodynamically significant stenosis. Left ICA patent from the bifurcation to the skull base without stenosis, dissection or occlusion. Vertebral arteries: Both of the vertebral arteries are hypoplastic and arise from the subclavian arteries. Vertebral arteries attenuated but patent proximally, but essentially occludes by the distal V2/V3 segments bilaterally. Vertebral arteries are occluded by the skull base. This is suspected to be chronic in nature. Skeleton: No acute osseous finding. No discrete lytic or blastic osseous lesions. Other neck: No other acute soft tissue abnormality within the neck. No adenopathy. Chronic left maxillary sinusitis noted. Upper chest: Layering secretions present within the subglottic trachea and proximal mainstem bronchi, greater on the right. Patient likely at risk for aspiration. Visualized upper chest demonstrates no other acute finding. Partially visualized lungs are largely clear. Review of the MIP images confirms the above findings CTA HEAD FINDINGS Anterior circulation: Petrous segments patent bilaterally. Mild scattered atheromatous plaque within the cavernous/supraclinoid ICAs without high-grade stenosis. ICA termini well perfused. A1 segments patent bilaterally.  Normal anterior communicating artery. Multifocal severe segmental stenoses seen within the proximal-mid A2 segments bilaterally without occlusion. ACAs are patent to their distal aspects. Left M1 patent without high-grade stenosis. Normal left MCA bifurcation. Severe proximal M2 stenoses involving both the superior and inferior divisions, with additional advanced atheromatous irregularity and stenoses throughout the left MCA branches. Right M1 patent proximally. Short-segment moderate mid-distal right M1 stenosis (series 11, image 14). Normal right MCA bifurcation. Advanced small vessel atheromatous irregularity throughout the right MCA branches distally. Posterior circulation: Vertebral arteries occluded at the skull base. Opacification of the distal V4 segments bilaterally favored to be retrograde in nature via the anterior circulation. Right vertebral artery appears to be dominant. Right PICA patent proximally. Distal left V4 segment faintly opacified with faint perfusion of the left PICA. Basilar diminutive with focal short-segment moderate stenosis at its proximal aspect (series 9, image 123). Basilar otherwise patent to its distal aspect without high-grade stenosis. Superior cerebral arteries patent bilaterally. Left PCA supplied via the basilar as well as a prominent left posterior communicating artery. Fetal type origin of the right PCA. Focal short-segment severe stenosis at the right posterior communicating artery noted (series 8, image 85). Multifocal segmental severe stenoses seen throughout the left P2 segment. Extensive distal small vessel atheromatous irregularity throughout the PCA branches distally. Venous sinuses: Grossly patent, although not well assessed due to arterial timing the contrast bolus. Anatomic variants: Predominant fetal type origin of the PCAs with overall diminutive vertebrobasilar system. Delayed phase: Not performed. Review of the MIP images confirms the above findings CT Brain  Perfusion Findings: CBF (<30%) Volume: 25mL Perfusion (Tmax>6.0s) volume: 63mL Mismatch Volume: 74mL Infarction Location:Negative. IMPRESSION: 1. Negative CTA for emergent large vessel occlusion. 2. No evidence for acute infarct by CT perfusion. 3. Hypoplastic vertebral arteries bilaterally, occluded within the neck. Opacification of the intracranial vertebrobasilar system  likely retrograde in nature via collateral flow from the anterior circulation. 4. Extensive intracranial atherosclerotic changes above, with most notable findings including severe multifocal bilateral A2, proximal M2, and left P2 stenoses. These results were communicated to Wilford Cornerrora at 9:19 pmon 4/6/2020by text page via the [facility] messaging system. Electronically Signed   By: Rise MuBenjamin  McClintock M.D.   On: 02/13/2019 21:47   Ct Cerebral Perfusion W Contrast  Result Date: 02/13/2019 CLINICAL DATA:  Initial evaluation for acute speech difficulty. EXAM: CT ANGIOGRAPHY HEAD AND NECK CT PERFUSION BRAIN TECHNIQUE: Multidetector CT imaging of the head and neck was performed using the standard protocol during bolus administration of intravenous contrast. Multiplanar CT image reconstructions and MIPs were obtained to evaluate the vascular anatomy. Carotid stenosis measurements (when applicable) are obtained utilizing NASCET criteria, using the distal internal carotid diameter as the denominator. Multiphase CT imaging of the brain was performed following IV bolus contrast injection. Subsequent parametric perfusion maps were calculated using RAPID software. CONTRAST:  100mL OMNIPAQUE IOHEXOL 350 MG/ML SOLN COMPARISON:  Prior CT from earlier same day. FINDINGS: CTA NECK FINDINGS Aortic arch: Visualized aortic arch of normal caliber with normal 3 vessel morphology. Mild atherosclerotic change about the aortic arch. No hemodynamically significant stenosis about the origin of the great vessels. Visualized subclavian arteries widely patent. Right carotid system:  Right common carotid artery patent from its origin to the bifurcation without stenosis. Mild mixed plaque about the right bifurcation without hemodynamically significant stenosis. Right ICA widely patent to the skull base without stenosis, dissection or occlusion. Left carotid system: Left common carotid artery patent from its origin to the bifurcation without stenosis. Mild calcified plaque about the left bifurcation without hemodynamically significant stenosis. Left ICA patent from the bifurcation to the skull base without stenosis, dissection or occlusion. Vertebral arteries: Both of the vertebral arteries are hypoplastic and arise from the subclavian arteries. Vertebral arteries attenuated but patent proximally, but essentially occludes by the distal V2/V3 segments bilaterally. Vertebral arteries are occluded by the skull base. This is suspected to be chronic in nature. Skeleton: No acute osseous finding. No discrete lytic or blastic osseous lesions. Other neck: No other acute soft tissue abnormality within the neck. No adenopathy. Chronic left maxillary sinusitis noted. Upper chest: Layering secretions present within the subglottic trachea and proximal mainstem bronchi, greater on the right. Patient likely at risk for aspiration. Visualized upper chest demonstrates no other acute finding. Partially visualized lungs are largely clear. Review of the MIP images confirms the above findings CTA HEAD FINDINGS Anterior circulation: Petrous segments patent bilaterally. Mild scattered atheromatous plaque within the cavernous/supraclinoid ICAs without high-grade stenosis. ICA termini well perfused. A1 segments patent bilaterally. Normal anterior communicating artery. Multifocal severe segmental stenoses seen within the proximal-mid A2 segments bilaterally without occlusion. ACAs are patent to their distal aspects. Left M1 patent without high-grade stenosis. Normal left MCA bifurcation. Severe proximal M2 stenoses  involving both the superior and inferior divisions, with additional advanced atheromatous irregularity and stenoses throughout the left MCA branches. Right M1 patent proximally. Short-segment moderate mid-distal right M1 stenosis (series 11, image 14). Normal right MCA bifurcation. Advanced small vessel atheromatous irregularity throughout the right MCA branches distally. Posterior circulation: Vertebral arteries occluded at the skull base. Opacification of the distal V4 segments bilaterally favored to be retrograde in nature via the anterior circulation. Right vertebral artery appears to be dominant. Right PICA patent proximally. Distal left V4 segment faintly opacified with faint perfusion of the left PICA. Basilar diminutive with focal short-segment moderate stenosis  at its proximal aspect (series 9, image 123). Basilar otherwise patent to its distal aspect without high-grade stenosis. Superior cerebral arteries patent bilaterally. Left PCA supplied via the basilar as well as a prominent left posterior communicating artery. Fetal type origin of the right PCA. Focal short-segment severe stenosis at the right posterior communicating artery noted (series 8, image 85). Multifocal segmental severe stenoses seen throughout the left P2 segment. Extensive distal small vessel atheromatous irregularity throughout the PCA branches distally. Venous sinuses: Grossly patent, although not well assessed due to arterial timing the contrast bolus. Anatomic variants: Predominant fetal type origin of the PCAs with overall diminutive vertebrobasilar system. Delayed phase: Not performed. Review of the MIP images confirms the above findings CT Brain Perfusion Findings: CBF (<30%) Volume: 0mL Perfusion (Tmax>6.0s) volume: 0mL Mismatch Volume: 0mL Infarction Location:Negative. IMPRESSION: 1. Negative CTA for emergent large vessel occlusion. 2. No evidence for acute infarct by CT perfusion. 3. Hypoplastic vertebral arteries bilaterally,  occluded within the neck. Opacification of the intracranial vertebrobasilar system likely retrograde in nature via collateral flow from the anterior circulation. 4. Extensive intracranial atherosclerotic changes above, with most notable findings including severe multifocal bilateral A2, proximal M2, and left P2 stenoses. These results were communicated to Wilford Corner at 9:19 pmon 4/6/2020by text page via the [facility] messaging system. Electronically Signed   By: Rise Mu M.D.   On: 02/13/2019 21:47   Ct Head Code Stroke Wo Contrast  Result Date: 02/13/2019 CLINICAL DATA:  Code stroke.  Right facial droop EXAM: CT HEAD WITHOUT CONTRAST TECHNIQUE: Contiguous axial images were obtained from the base of the skull through the vertex without intravenous contrast. COMPARISON:  None. FINDINGS: Brain: There is no mass, hemorrhage or extra-axial collection. There is generalized atrophy without lobar predilection. Hypodensity of the white matter is most commonly associated with chronic microvascular disease. Old bilateral basal ganglia lacunar infarcts. Is an old right occipital lobe infarct. Vascular: No abnormal hyperdensity of the major intracranial arteries or dural venous sinuses. No intracranial atherosclerosis. Skull: The visualized skull base, calvarium and extracranial soft tissues are normal. Sinuses/Orbits: Complete opacification of the left maxillary sinus. The orbits are normal. IMPRESSION: 1. No acute intracranial hemorrhage. 2. Atrophy and advanced chronic microvascular disease. 3. ASPECTS is 10. These results were communicated to Dr. Milon Dikes at 9:00 pm on 02/13/2019 by text page via the [facility] messaging system. Electronically Signed   By: Deatra Robinson M.D.   On: 02/13/2019 21:02    PHYSICAL EXAM  Temp:  [97.5 F (36.4 C)-98.1 F (36.7 C)] 97.5 F (36.4 C) (04/07 0800) Pulse Rate:  [56-107] 106 (04/07 0700) Resp:  [9-23] 19 (04/07 0700) BP: (111-208)/(40-98) 155/73 (04/07 0700) SpO2:  [91  %-100 %] 91 % (04/07 0700) Weight:  [46.9 kg-49.1 kg] 46.9 kg (04/06 2156)  General - cachectic, well developed, in no apparent distress.  Ophthalmologic - fundi not visualized due to noncooperation.  Cardiovascular - Regular rate and rhythm.  Neuro - awake alert, eyes open, orientated to self, but not to time or place. Paucity of language output, intangible words, not able to name but able to repeat. Moderate dysarthria. Not quite following commands, but able to pantomime some actions. Blinking to visual threat bilaterally, EOMI, no gaze deviation, PERRL. Right facial droop. Tongue protrusion not cooperative. Moving all extremities spontaneously and symmetrically, BUE 4/5 at least, BLE 3-/5. DTR 1+ and no babinski. Sensation, coordination and gait not tested.   ASSESSMENT/PLAN Ms. Anne Hernandez is a 85 y.o. female with history of  dementia, hypertension, hyperlipidemia, diabetes, lives in a assisted living facility presenting with R facial droop and inability to follow commands. Received IV tPA 02/13/2019 at 2110.   Stroke:  Suspect left brain stroke, etiology unclear  Code Stroke CT head No acute stroke.      CTA head & neck no ELVO. Hypoplastic VAs occluded in neck. Extensive atherosclerosis: B A2, prox M2, L P2  CT perfusion no acute infarct  MRI  pending  2D Echo EF 55%  LDL 166  HgbA1c 8.3  SCDs for VTE prophylaxis  No antithrombotic prior to admission, now on No antithrombotic within 24h of tPA administration. Plan to start antithrombotics post MRI if no bleeding  Therapy recommendations:  SNF  Disposition:  pending  (from ALF)  Hypertension  BP as high as 208/98  Home meds:  none BP goal per post tPA protocol x 24h following tPA administration  Off Cleviprex gtt  On amlodipine  . Long-term BP goal normotensive  Hyperlipidemia  Home meds:  none  LDL 166, goal < 70  Add lipitor 40  Continue statin at discharge  Diabetes type II  Uncntrolled  Home meds:  unknown  HgbA1c 8.3, goal < 7.0  CBGs  SSI  Close PCP follow up  Other Stroke Risk Factors  Advanced age  ETOH use, advised to drink no more than 1 drink(s) a day  PVD  Other Active Problems  Baseline dementia - Alzheimer's   Hospital day # 1  This patient is critically ill due to stroke status post TPA, hypertensive urgency, diabetes, Alzheimer's disease and at significant risk of neurological worsening, death form recurrent stroke, hemorrhagic conversion, hypertensive encephalopathy, DKA, psychosis. This patient's care requires constant monitoring of vital signs, hemodynamics, respiratory and cardiac monitoring, review of multiple databases, neurological assessment, discussion with family, other specialists and medical decision making of high complexity. I spent 40 minutes of neurocritical care time in the care of this patient.  Marvel Plan, MD PhD Stroke Neurology 02/14/2019 6:20 PM  To contact Stroke Continuity provider, please refer to [email or web address]. After hours, contact General Neurology

## 2019-02-14 NOTE — Progress Notes (Signed)
  Echocardiogram 2D Echocardiogram has been performed.  Janalyn Harder 02/14/2019, 9:25 AM

## 2019-02-14 NOTE — Progress Notes (Addendum)
Modified Barium Swallow Progress Note  Patient Details  Name: Anne Hernandez MRN: [id number] Date of Birth: [birth year]  Today's Date: 02/14/2019  Modified Barium Swallow completed.  Full report located under Chart Review in the Imaging Section.  Brief recommendations include the following:  Clinical Impression  Patient presents with mild oropharyngeal dysphagia characterized by decreased lingual coordination/oral cohesion resulting in premature spillage of barium into pharynx.  Pharyngeal swallow timing mildly impaired allowing laryngeal penetration of thin - consistently- and nectar x1 only - due to inadequate laryngeal closure.  Laryngeal penetration of nectar occurred when pt had oral particulates of masticated barium tablet retained.  Pt tends to take large liquid boluses which will increase her aspiration risk with thinner liquids.  Mastication was functional and pharyngeal swallow strong with only minimal residuals inconsisently present.  Pt epiglottic position is anterior allowing boluses to spill over epiglottis rather than pooling at vallecular space.  Upon esophageal sweep, pt appeared clear. Radiologist not present to confirm.  Recommend pt start a dys3/nectar diet for maximal safety.   Given lack of aspiration, and mild penetration, clinical advancement of diet indicated.  Pt appears with anterior cervical osteophytes from C5-C6, C6-C7 but this did not impair barium flow.    Swallow Evaluation Recommendations       SLP Diet Recommendations: Dysphagia 3 (Mech soft) solids;Nectar thick liquid   Liquid Administration via: Cup;Straw   Medication Administration: Crushed with puree   Supervision: Staff to assist with self feeding   Compensations: Slow rate;Small sips/bites   Postural Changes: Remain semi-upright after after feeds/meals (Comment);Seated upright at 90 degrees   Oral Care Recommendations: Oral care BID   Other Recommendations: Order thickener from  pharmacy   Donavan Burnet, MS [facility] SLP Acute Rehab Services Pager [phone] Office [phone]   Chales Abrahams 02/14/2019,2:23 PM

## 2019-02-14 NOTE — Progress Notes (Signed)
OT Cancellation Note  Patient Details Name: Anne Hernandez MRN: [id number] DOB: 1930-08-06   Cancelled Treatment:    Reason Eval/Treat Not Completed: Active bedrest order(IV TPA). Will return as schedule allows. Thank you.  Rolla Kedzierski M Roey Coopman Chiana Wamser MSOT, OTR/L Acute Rehab Pager: [phone] Office: [phone] 02/14/2019, 7:11 AM

## 2019-02-14 NOTE — Evaluation (Signed)
Speech Language Pathology Evaluation Patient Details Name: Anne Hernandez CONNAREE Smalling MRN: [id number] DOB: June 03, 1930 Today's Date: 02/14/2019 Time: 1191-47821011-1025 SLP Time Calculation (min) (ACUTE ONLY): 14 min  Problem List:  Patient Active Problem List   Diagnosis Date Noted  . Acute ischemic stroke (HCC) 02/13/2019  . Mixed Alzheimer's and vascular dementia (HCC) 12/13/2013  . Folliculitis 12/13/2013  . Alzheimer's disease (HCC) 01/24/2013  . Hyperlipidemia 01/24/2013  . Peripheral vascular disease, unspecified (HCC) 01/24/2013  . Essential hypertension, benign 01/24/2013  . Pain in joint, lower leg 01/24/2013  . Disorder of bone and cartilage, unspecified 01/24/2013  . Diabetes (HCC) 01/24/2013  . Pruritic dermatitis 01/24/2013   Past Medical History:  Past Medical History:  Diagnosis Date  . Alzheimer's disease (HCC)   . Anemia   . Cellulitis and abscess of trunk   . Disorder of bone and cartilage, unspecified   . Hyperlipidemia   . Hypertension   . Leukocytosis, unspecified   . Muscle weakness (generalized)   . Other abnormal glucose   . Pain in joint, lower leg   . Peripheral vascular disease, unspecified (HCC)   . Senile dementia, uncomplicated (HCC)   . Type II or unspecified type diabetes mellitus without mention of complication, not stated as uncontrolled    Past Surgical History:  Past Surgical History:  Procedure Laterality Date  . OOPHORECTOMY    . TONSILLECTOMY     HPI:  (P) 85 [facility], right facial droop and aphasia. Pt also with ? aspiration at facility per RN statement *from her handoff report.  Pt with h/o dementia and is in a memory care until at facility. Pt also recently lost 3 lbs and was treated for recent UTI.    Assessment / Plan / Recommendation Clinical Impression  Patient currently presents with clinical indications of significant aphasia - expressive more than receptive - however no family present and given pt's baseline  dementia, uncertain to level of change with this event.  Note her CT head negative for acute event but MRI pending.  Pt is able to verbalize yes/no in functional situation and is also obviously using nonverbal cues for appropriate responses.  She frequently states "I don't know" during testing or simply does not answer.  She was able to identify her name from large print written choice of 2.  Expressive language with jargon and neologisms with islands of fluent speech.  Will follow up to determine baseline and focus on functional basic language skills to decrease caregiver burden and maximize pt's participation in her care.      SLP Assessment  SLP Recommendation/Assessment: Patient needs continued Speech Lanaguage Pathology Services SLP Visit Diagnosis: Aphasia (R47.01);Cognitive communication deficit (R41.841)    Follow Up Recommendations  Skilled Nursing facility    Frequency and Duration min 1 x/week  1 week      SLP Evaluation Cognition  Arousal/Alertness: Awake/alert Orientation Level: Oriented to person;Disoriented to place;Disoriented to time;Disoriented to situation Attention: Focused;Sustained Focused Attention: Appears intact Sustained Attention: Impaired Memory: Impaired Memory Impairment: Retrieval deficit;Storage deficit;Decreased recall of new information Awareness: Impaired Problem Solving: Impaired Safety/Judgment: Impaired       Comprehension  Auditory Comprehension Overall Auditory Comprehension: Impaired Yes/No Questions: Impaired Other Yes/No Questions Comments`: Minimal Commands: Impaired One Step Basic Commands: 0-24% accurate(inconsistently with visual cues) Interfering Components: Processing speed;Working Theatre managermemory Visual Recognition/Discrimination Discrimination: Exceptions to [facility] Discrimination: Unable to indentify Common Objects: Unable to indentify Reading Comprehension Reading Status: Impaired Word level: (able to identify her  name from choice  of two) Sentence Level: Not tested Paragraph Level: Not tested Functional Environmental (signs, name badge): Not tested    Expression Expression Primary Mode of Expression: Verbal Verbal Expression Overall Verbal Expression: Other (comment)(no family present to establish baseline) Initiation: Impaired Automatic Speech: (pt did not participate in counting or singing) Level of Generative/Spontaneous Verbalization: Word;Phrase Repetition: Impaired(able to repeat "I'm ok" as SlP was leaving room) Level of Impairment: Word level(pt did not repeat at C-V level) Naming: Impairment Responsive: 0-25% accurate Confrontation: Impaired Common Objects: Unable to indentify Convergent: 0-24% accurate Other Naming Comments: despite multiple choice cue, pt did not correctly identify verbal or written word Verbal Errors: Neologisms;Language of confusion;Not aware of errors;Jargon Pragmatics: No impairment Interfering Components: Attention Non-Verbal Means of Communication: Not applicable Written Expression Dominant Hand: (unknown) Written Expression: Not tested   Oral / Motor  Oral Motor/Sensory Function Overall Oral Motor/Sensory Function: (P) Moderate impairment Facial ROM: (P) Reduced right;Suspected CN VII (facial) dysfunction Facial Symmetry: (P) Abnormal symmetry right Facial Strength: (P) Reduced right;Suspected CN VII (facial) dysfunction Lingual ROM: (P) Reduced right;Suspected CN XII (hypoglossal) dysfunction Lingual Strength: (P) Reduced;Suspected CN XII (hypoglossal) dysfunction Velum: (P) Within Functional Limits Motor Speech Overall Motor Speech: Impaired Respiration: Impaired Level of Impairment: Phrase Phonation: Hoarse;Low vocal intensity Articulation: Within functional limitis Intelligibility: Intelligible Motor Planning: (pt repeated single words in functional situation) Motor Speech Errors: Not applicable   GO                    Chales Abrahams 02/14/2019, 11:14  AM  Donavan Burnet, MS [facility] SLP Acute Rehab Services Pager [phone] Office [phone]

## 2019-02-14 NOTE — Evaluation (Signed)
Occupational Therapy Evaluation Patient Details Name: TIFNEY JEWART MRN: [id number] DOB: 1930/10/28 Today's Date: 02/14/2019    History of Present Illness 85 yo admitted with aphasia and slumped in W/C at ALF [facility] home) no hemorrhage on CT and await MRI. PMhx: dementia, HTN, HLD, DM   Clinical Impression   PTA, pt was living at a ALF [facility]) in memory care and used w/c; per chart review. Pt currently requiring Max-Total A for ADLs due to cognitive deficits and poor attention and following commands. Pt presenting with decreased balance, strength, cognition, and safety. When asking pt what she would like to be called, she stated "north". Pt also unable to follow commands for donning socks with initiation and demonstration of task. Pt would benefit from further acute OT to facilitate safe dc. Pending support at ALF, recommend dc to SNF for further OT to optimize safety, independence with ADLs, and return to PLOF.      Follow Up Recommendations  SNF;Supervision/Assistance - 24 hour    Equipment Recommendations  Other (comment)(Defer to next venue)    Recommendations for Other Services       Precautions / Restrictions Precautions Precautions: Fall Restrictions Weight Bearing Restrictions: No      Mobility Bed Mobility Overal bed mobility: Needs Assistance Bed Mobility: Supine to Sit;Sit to Supine     Supine to sit: Mod assist;+2 for safety/equipment Sit to supine: Min assist;+2 for safety/equipment   General bed mobility comments: HOB elevated to pivot from supine to EOB with physical assist to bring legs off bed and elevate trunk. with return to supine pt required assist to initiate descent to surface with min assist to bring legs to surface  Transfers Overall transfer level: Needs assistance   Transfers: Sit to/from Stand Sit to Stand: Min assist;+2 safety/equipment         General transfer comment: min assist to stand from EOB with assist to initiate  rise, HHA to stand    Balance Overall balance assessment: Needs assistance   Sitting balance-Leahy Scale: Fair   Postural control: Posterior lean   Standing balance-Leahy Scale: Poor Standing balance comment: posterior lean with mod assist to maintain standing                           ADL either performed or assessed with clinical judgement   ADL Overall ADL's : Needs assistance/impaired Eating/Feeding: NPO                                     General ADL Comments: Max-Total A due to cognition and poor following of commands. Pt highly distractable     Vision Baseline Vision/History: Wears glasses       Perception     Praxis      Pertinent Vitals/Pain Pain Assessment: Faces Faces Pain Scale: No hurt Pain Intervention(s): Monitored during session     Hand Dominance (unknown)   Extremity/Trunk Assessment Upper Extremity Assessment Upper Extremity Assessment: Generalized weakness;Difficult to assess due to impaired cognition   Lower Extremity Assessment Lower Extremity Assessment: Defer to PT evaluation   Cervical / Trunk Assessment Cervical / Trunk Assessment: Kyphotic   Communication Communication Communication: Expressive difficulties;Receptive difficulties   Cognition Arousal/Alertness: Awake/alert Behavior During Therapy: Flat affect Overall Cognitive Status: Impaired/Different from baseline Area of Impairment: Memory;Orientation;Attention;Following commands;Safety/judgement;Problem solving  Orientation Level: Disoriented to;Place;Time;Situation Current Attention Level: Focused   Following Commands: Follows one step commands inconsistently Safety/Judgement: Decreased awareness of safety;Decreased awareness of deficits   Problem Solving: Slow processing;Decreased initiation General Comments: Baseline dementia. pt highly distractable by visual items in room as well as different sounds. Pt able to follow  single step commands with increased time with physical assist to initiate movement.   General Comments  VSS    Exercises     Shoulder Instructions      Home Living Family/patient expects to be discharged to:: Assisted living   Available Help at Discharge: Other (Comment)(memory facility)                                Lives With: Other (Comment)(at a facility, memory care)    Prior Functioning/Environment Level of Independence: Needs assistance        Comments: pt from ALF no family present to confirm PLOF. Pt has some use of W/C and some gait but unclear how [facility]        OT Problem List: Decreased strength;Decreased range of motion;Decreased activity tolerance      OT Treatment/Interventions: Self-care/ADL training;Therapeutic exercise;Energy conservation;DME and/or AE instruction;Therapeutic activities;Patient/family education    OT Goals(Current goals can be found in the care plan section) Acute Rehab OT Goals Patient Stated Goal: "Go to bed" OT Goal Formulation: Patient unable to participate in goal setting Time For Goal Achievement: 02/28/19 Potential to Achieve Goals: Good  OT Frequency: Min 2X/week   Barriers to D/C:            Co-evaluation PT/OT/SLP Co-Evaluation/Treatment: Yes Reason for Co-Treatment: Complexity of the patient's impairments (multi-system involvement);Necessary to address cognition/behavior during functional activity   OT goals addressed during session: ADL's and self-care      AM-PAC OT "6 Clicks" Daily Activity     Outcome Measure Help from another person eating meals?: Total Help from another person taking care of personal grooming?: Total Help from another person toileting, which includes using toliet, bedpan, or urinal?: Total Help from another person bathing (including washing, rinsing, drying)?: Total Help from another person to put on and taking off regular upper body clothing?:  Total Help from another person to put on and taking off regular lower body clothing?: Total 6 Click Score: 6   End of Session Equipment Utilized During Treatment: Gait belt;Rolling walker Nurse Communication: Mobility status  Activity Tolerance: Patient tolerated treatment well Patient left: in bed;with call bell/phone within reach;with bed alarm set;with nursing/sitter in room  OT Visit Diagnosis: Unsteadiness on feet (R26.81);Other abnormalities of gait and mobility (R26.89);Muscle weakness (generalized) (M62.81);Other symptoms and signs involving cognitive function                Time: 1125-1141 OT Time Calculation (min): 16 min Charges:  OT General Charges $OT Visit: 1 Visit OT Evaluation $OT Eval Moderate Complexity: 1 Mod  Lyndon Chapel MSOT, OTR/L Acute Rehab Pager: [phone] Office: [phone]  Theodoro GristCharis M Sirinity Outland 02/14/2019, 12:44 PM

## 2019-02-14 NOTE — Evaluation (Signed)
Clinical/Bedside Swallow Evaluation Patient Details  Name: Anne Hernandez MRN: [id number] Date of Birth: August 23, 1930  Today's Date: 02/14/2019 Time: SLP Start Time (ACUTE ONLY): 1011 SLP Stop Time (ACUTE ONLY): 1025 SLP Time Calculation (min) (ACUTE ONLY): 14 min  Past Medical History:  Past Medical History:  Diagnosis Date  . Alzheimer's disease (HCC)   . Anemia   . Cellulitis and abscess of trunk   . Disorder of bone and cartilage, unspecified   . Hyperlipidemia   . Hypertension   . Leukocytosis, unspecified   . Muscle weakness (generalized)   . Other abnormal glucose   . Pain in joint, lower leg   . Peripheral vascular disease, unspecified (HCC)   . Senile dementia, uncomplicated (HCC)   . Type II or unspecified type diabetes mellitus without mention of complication, not stated as uncontrolled    Past Surgical History:  Past Surgical History:  Procedure Laterality Date  . OOPHORECTOMY    . TONSILLECTOMY     HPI:  85 yo female adm to [facility] with AMS, right facial droop and aphasia. Pt also with ? aspiration at facility per RN statement *from her handoff report.  Pt with h/o dementia and is in a memory care until at facility. Pt also recently lost 3 lbs and was treated for recent UTI.    Assessment / Plan / Recommendation Clinical Impression  Pt was seen at beside to assess readiness for PO intake. Pt frequently said, "I can't" and "I don't know", but was able to follow commands for oral mech exam and respond to Y/N questions with cueing. Observed moderate R sided facial and lingual weakness. Pt reluctant to accept most liquids and solids; grimaced when accepting bites, but denied pain. Pt demonstrated slow mastication and prolonged oral transit of puree and regular consistencies. No significant oral residue noted. Observed wet cough and throat clearing after taking sips of thin liquid from cup, raising concern for aspiration. CT results showing increased secretions in trachea  suggest need for instrumental testing. Recommend MBS to assess oropharyngeal function. Will make diet recommendations after MBS.  SLP Visit Diagnosis: Dysphagia, unspecified (R13.10)    Aspiration Risk       Diet Recommendation          Other  Recommendations Oral Care Recommendations: Oral care BID   Follow up Recommendations Skilled Nursing facility      Frequency and Duration min 1 x/week          Prognosis Prognosis for Safe Diet Advancement: Fair Barriers to Reach Goals: Language deficits;Cognitive deficits;Severity of deficits      Swallow Study   General Date of Onset: 02/14/19 HPI: 85 yo female adm to [facility] with AMS, right facial droop and aphasia. Pt also with ? aspiration at facility per RN statement *from her handoff report.  Pt with h/o dementia and is in a memory care until at facility. Pt also recently lost 3 lbs and was treated for recent UTI.  Type of Study: MBS-Modified Barium Swallow Study Diet Prior to this Study: Information not available Temperature Spikes Noted: No Respiratory Status: Room air History of Recent Intubation: No Behavior/Cognition: Alert;Cooperative Oral Cavity Assessment: Excessive secretions Oral Care Completed by SLP: Yes Oral Cavity - Dentition: Adequate natural dentition Self-Feeding Abilities: Total assist Patient Positioning: Upright in bed Baseline Vocal Quality: Hoarse;Low vocal intensity Volitional Cough: Cognitively unable to elicit Volitional Swallow: Unable to elicit    Oral/Motor/Sensory Function Overall Oral Motor/Sensory Function: Moderate impairment Facial ROM: Reduced right;Suspected CN VII (facial)  dysfunction Facial Symmetry: Abnormal symmetry right Facial Strength: Reduced right;Suspected CN VII (facial) dysfunction Lingual ROM: Reduced right;Suspected CN XII (hypoglossal) dysfunction Lingual Strength: Reduced;Suspected CN XII (hypoglossal) dysfunction Velum: Within Functional Limits   Ice Chips Ice chips:  Impaired Presentation: Spoon Pharyngeal Phase Impairments: Suspected delayed Swallow;Decreased hyoid-laryngeal movement   Thin Liquid Thin Liquid: Impaired Presentation: Cup;Straw;Spoon Oral Phase Impairments: Other (comment)(unable to sip liquid through straw) Pharyngeal  Phase Impairments: Wet Vocal Quality;Throat Clearing - Immediate;Cough - Immediate(w/ cup drinking)    Nectar Thick Nectar Thick Liquid: Not tested   Honey Thick Honey Thick Liquid: Not tested   Puree Puree: Impaired Presentation: Spoon Oral Phase Functional Implications: Prolonged oral transit Pharyngeal Phase Impairments: Multiple swallows   Solid     Solid: Impaired Presentation: Self Fed Oral Phase Impairments: Impaired mastication(slow) Oral Phase Functional Implications: Prolonged oral transit      Arvil ChacoJusteen H Rhonin Trott 02/14/2019,11:36 AM  Suzan GaribaldiJusteen Ronie Fleeger, M.Ed., CCC-SLP Acute Rehab [phone]

## 2019-02-14 NOTE — TOC Initial Note (Signed)
Transition of Care [facility](TOC) - Initial/Assessment Note    Patient Details  Name: Anne Hernandez MRN: [id number] Date of Birth: [birth year]  Transition of Care [facility](TOC) CM/SW Contact:    Baldemar LenisElizabeth M Ceana Fiala, LCSW Phone Number: 02/14/2019, 1:26 PM  Clinical Narrative:   Patient's sister confirmed plan for [facility] to [facility], agreeable to therapy services. CSW discussed with [facility] admissions the patient's case, and they will review therapy notes to determine best placement for her when she returns. Per discussions with both sister and [facility], patient was able to walk on her own and required minimal to supervision assist with her ADLs. Patient's sister reported that the patient prefers to be called Anne Hernandez. CSW contacted RN to provide update for medical team. CSW to follow.                Expected Discharge Plan: Memory Care Barriers to Discharge: Continued Medical Work up   Patient Goals and CMS Choice Patient states their goals for this hospitalization and ongoing recovery are:: patient unable to participate in goal setting CMS Medicare.gov Compare Post Acute Care list provided to:: Patient Represenative (must comment) Choice offered to / list presented to : [facility] / Guardian  Expected Discharge Plan and Services Expected Discharge Plan: Memory Care     Post Acute Care Choice: Nursing Home Living arrangements for the past 2 months: Assisted Living Facility                          Prior Living Arrangements/Services Living arrangements for the past 2 months: Assisted Living Facility Lives with:: Facility Resident Patient language and need for interpreter reviewed:: No Do you feel safe going back to the place where you live?: Yes      Need for Family Participation in Patient Care: Yes (Comment) Care giver support system in place?: Yes (comment)   Criminal Activity/Legal Involvement Pertinent to Current Situation/Hospitalization: No - Comment as  needed  Activities of Daily Living   ADL Screening (condition at time of admission) Patient's cognitive ability adequate to safely complete daily activities?: No Is the patient deaf or have difficulty hearing?: No Does the patient have difficulty seeing, even when wearing glasses/contacts?: No Does the patient have difficulty concentrating, remembering, or making decisions?: Yes Patient able to express need for assistance with ADLs?: Yes Does the patient have difficulty dressing or bathing?: Yes Independently performs ADLs?: No Communication: Needs assistance Is this a change from baseline?: Change from baseline, expected to last <3 days Dressing (OT): Needs assistance Is this a change from baseline?: Change from baseline, expected to last >3 days Grooming: Needs assistance Is this a change from baseline?: Change from baseline, expected to last >3 days Feeding: Needs assistance Is this a change from baseline?: Change from baseline, expected to last >3 days Bathing: Needs assistance Is this a change from baseline?: Change from baseline, expected to last >3 days Toileting: Needs assistance Is this a change from baseline?: Change from baseline, expected to last >3days In/Out Bed: Needs assistance Is this a change from baseline?: Change from baseline, expected to last >3 days Walks in Home: Needs assistance Is this a change from baseline?: Change from baseline, expected to last >3 days Does the patient have difficulty walking or climbing stairs?: Yes Weakness of Legs: Both Weakness of Arms/Hands: Both  Permission Sought/Granted Permission sought to share information with : Facility Medical sales representativeContact Representative, Family Supports Permission granted to share information with : Yes, Verbal  Permission Granted  Share Information with NAME: Eber Jones  Permission granted to share info w AGENCY: [employer]  Permission granted to share info w Relationship: Sister/HCPOA     Emotional  Assessment Appearance:: Appears stated age Attitude/Demeanor/Rapport: Unable to Assess Affect (typically observed): Unable to Assess Orientation: : Oriented to Self Alcohol / Substance Use: Not Applicable Psych Involvement: No (comment)  Admission diagnosis:  Stroke Patient Active Problem List   Diagnosis Date Noted  . Acute ischemic stroke (HCC) 02/13/2019  . Mixed Alzheimer's and vascular dementia (HCC) 12/13/2013  . Folliculitis 12/13/2013  . Alzheimer's disease (HCC) 01/24/2013  . Hyperlipidemia 01/24/2013  . Peripheral vascular disease, unspecified (HCC) 01/24/2013  . Essential hypertension, benign 01/24/2013  . Pain in joint, lower leg 01/24/2013  . Disorder of bone and cartilage, unspecified 01/24/2013  . Diabetes (HCC) 01/24/2013  . Pruritic dermatitis 01/24/2013   PCP:  No primary care provider on file. Pharmacy:   [facility] [address] - [place], [place] - [address] [place] [address] Phone: [phone] Fax: [phone]     Social Determinants of Health (SDOH) Interventions    Readmission Risk Interventions No flowsheet data found.

## 2019-02-14 NOTE — Evaluation (Signed)
Physical Therapy Evaluation Patient Details Name: Anne Hernandez CONNAREE Walko MRN: [id number] DOB: 1930/03/19 Today's Date: 02/14/2019   History of Present Illness  85 yo admitted with aphasia and slumped in W/C at ALF [facility] no hemorrhage on CT and await MRI. PMhx: dementia, HTN, HLD, DM  Clinical Impression  Pt supine on arrival with knees flexed and fidgeting with gown. Pt unable to follow commands to don socks even with initiation and demonstration. Pt responding stating "north" for what she likes to be called but does not appear to be accurate. Pt with decreased strength, balance, cognition, speech, and function who will benefit from acute therapy to maximize safety and function to decrease burden of care. Per chart pt is from ALF and at least uses W/C at times but unclear what her true PLOF and home setup is. Recommend SNF with 24hr care unless pt required extensive assist at baseline and will continue to assess. Pt returned to bed per her request and need for bed for MBSS.      Follow Up Recommendations SNF;Supervision/Assistance - 24 hour    Equipment Recommendations  None recommended by PT    Recommendations for Other Services       Precautions / Restrictions Precautions Precautions: Fall      Mobility  Bed Mobility Overal bed mobility: Needs Assistance Bed Mobility: Supine to Sit;Sit to Supine     Supine to sit: Mod assist;+2 for safety/equipment Sit to supine: Min assist;+2 for safety/equipment   General bed mobility comments: HOB elevated to pivot from supine to EOB with physical assist to bring legs off bed and elevate trunk. with return to supine pt required assist to initiate descent to surface with min assist to bring legs to surface  Transfers Overall transfer level: Needs assistance   Transfers: Sit to/from Stand Sit to Stand: Min assist;+2 safety/equipment         General transfer comment: min assist to stand from EOB with assist to initiate rise, HHA to  stand  Ambulation/Gait Ambulation/Gait assistance: Mod assist;+2 physical assistance Gait Distance (Feet): 8 Feet Assistive device: Rolling walker (2 wheeled);2 person hand held assist Gait Pattern/deviations: Step-through pattern;Decreased stride length;Leaning posteriorly   Gait velocity interpretation: <1.31 ft/sec, indicative of household ambulator General Gait Details: pt with posterior lean in standing with bil HHA to stand and take steps away from bed. Transitioned to RW but pt pushing it too anteriorly and reaching for environment demonstrating desire to return to bed. Pt grossly 8' of ambulation with mod assist to control balance and function with assist to control RW and lines  Stairs            Wheelchair Mobility    Modified Rankin (Stroke Patients Only) Modified Rankin (Stroke Patients Only) Pre-Morbid Rankin Score: Moderately severe disability Modified Rankin: Moderately severe disability     Balance Overall balance assessment: Needs assistance   Sitting balance-Leahy Scale: Fair   Postural control: Posterior lean   Standing balance-Leahy Scale: Poor Standing balance comment: posterior lean with mod assist to maintain standing                             Pertinent Vitals/Pain Pain Assessment: No/denies pain    Home Living Family/patient expects to be discharged to:: Assisted living   Available Help at Discharge: Other (Comment)(memory facility)                  Prior Function Level of Independence: Needs assistance  Comments: pt from ALF no family present to confirm PLOF. Pt has some use of W/C and some gait but unclear how much assist she received at [facility] home     Hand Dominance   Dominant Hand: (unknown)    Extremity/Trunk Assessment   Upper Extremity Assessment Upper Extremity Assessment: Defer to OT evaluation    Lower Extremity Assessment Lower Extremity Assessment: Generalized weakness(pt with tendency  for bil knee and hip flexion and fetal position in bed)    Cervical / Trunk Assessment Cervical / Trunk Assessment: Kyphotic  Communication   Communication: Expressive difficulties;Receptive difficulties  Cognition Arousal/Alertness: Awake/alert Behavior During Therapy: Flat affect Overall Cognitive Status: Impaired/Different from baseline Area of Impairment: Memory;Orientation;Attention;Following commands;Safety/judgement;Problem solving                   Current Attention Level: Focused   Following Commands: Follows one step commands inconsistently Safety/Judgement: Decreased awareness of safety;Decreased awareness of deficits   Problem Solving: Slow processing;Decreased initiation General Comments: pt highly distractable by visual items in room as well as different sounds. Pt able to follow single step commands with increased time with physical assist to initiate movement.      General Comments      Exercises     Assessment/Plan    PT Assessment Patient needs continued PT services  PT Problem List Decreased range of motion;Decreased mobility;Decreased activity tolerance;Decreased strength;Decreased balance;Decreased cognition;Decreased knowledge of use of DME;Decreased coordination;Decreased safety awareness       PT Treatment Interventions DME instruction;Functional mobility training;Balance training;Patient/family education;Gait training;Therapeutic activities;Neuromuscular re-education;Therapeutic exercise;Cognitive remediation    PT Goals (Current goals can be found in the Care Plan section)  Acute Rehab PT Goals PT Goal Formulation: Patient unable to participate in goal setting Time For Goal Achievement: 02/28/19 Potential to Achieve Goals: Fair    Frequency Min 3X/week   Barriers to discharge        Co-evaluation PT/OT/SLP Co-Evaluation/Treatment: Yes             AM-PAC PT "6 Clicks" Mobility  Outcome Measure Help needed turning from your  back to your side while in a flat bed without using bedrails?: A Little Help needed moving from lying on your back to sitting on the side of a flat bed without using bedrails?: A Lot Help needed moving to and from a bed to a chair (including a wheelchair)?: A Lot Help needed standing up from a chair using your arms (e.g., wheelchair or bedside chair)?: A Little Help needed to walk in hospital room?: A Lot Help needed climbing 3-5 steps with a railing? : Total 6 Click Score: 13    End of Session   Activity Tolerance: Patient tolerated treatment well Patient left: in bed;with call bell/phone within reach;with nursing/sitter in room;with bed alarm set Nurse Communication: Mobility status;Precautions PT Visit Diagnosis: Other abnormalities of gait and mobility (R26.89);Muscle weakness (generalized) (M62.81);Difficulty in walking, not elsewhere classified (R26.2);Other symptoms and signs involving the nervous system (R29.898);Unsteadiness on feet (R26.81)    Time: 5056-9794 PT Time Calculation (min) (ACUTE ONLY): 15 min   Charges:   PT Evaluation $PT Eval Moderate Complexity: 1 Mod          Luetta Piazza Abner Greenspan, PT Acute Rehabilitation Services Pager: [phone] Office: [phone]   Enedina Finner Shreyas Piatkowski 02/14/2019, 12:15 PM

## 2019-02-14 NOTE — Progress Notes (Signed)
PT Cancellation Note  Patient Details Name: Anne Hernandez MRN: [id number] DOB: [birth year]   Cancelled Treatment:    Reason Eval/Treat Not Completed: Active bedrest order   Enedina Finner Kielyn Kardell 02/14/2019, 7:03 AM  Delaney Meigs, PT Acute Rehabilitation Services Pager: [phone] Office: [phone]

## 2019-02-14 NOTE — Progress Notes (Signed)
MRI scheduled for 2015. Will let night shift RN know.

## 2019-02-15 DIAGNOSIS — I680 Cerebral amyloid angiopathy: Secondary | ICD-10-CM

## 2019-02-15 LAB — BASIC METABOLIC PANEL
Anion gap: 9 (ref 5–15)
BUN: 5 mg/dL — ABNORMAL LOW (ref 8–23)
CO2: 24 mmol/L (ref 22–32)
Calcium: 9.2 mg/dL (ref 8.9–10.3)
Chloride: 104 mmol/L (ref 98–111)
Creatinine, Ser: 0.78 mg/dL (ref 0.44–1.00)
GFR calc Af Amer: >60 mL/min (ref >60)
GFR calc non Af Amer: >60 mL/min (ref >60)
Glucose, Bld: 132 mg/dL — ABNORMAL HIGH (ref 70–99)
Potassium: 2.8 mmol/L — ABNORMAL LOW (ref 3.5–5.1)
Sodium: 137 mmol/L (ref 135–145)

## 2019-02-15 LAB — URINALYSIS, COMPLETE (UACMP) WITH MICROSCOPIC
Bilirubin Urine: NEGATIVE
Glucose, UA: NEGATIVE mg/dL
Hgb urine dipstick: NEGATIVE
Ketones, ur: NEGATIVE mg/dL
Nitrite: NEGATIVE
Protein, ur: 100 mg/dL — AB
Specific Gravity, Urine: 1.021 (ref 1.005–1.030)
WBC, UA: >50 WBC/hpf — ABNORMAL HIGH (ref 0–5)
pH: 5 (ref 5.0–8.0)

## 2019-02-15 LAB — GLUCOSE, CAPILLARY
Glucose-Capillary: 142 mg/dL — ABNORMAL HIGH (ref 70–99)
Glucose-Capillary: 189 mg/dL — ABNORMAL HIGH (ref 70–99)
Glucose-Capillary: 123 mg/dL — ABNORMAL HIGH (ref 70–99)
Glucose-Capillary: 88 mg/dL (ref 70–99)

## 2019-02-15 LAB — CBC
HCT: 38.5 % (ref 36.0–46.0)
Hemoglobin: 13.2 g/dL (ref 12.0–15.0)
MCH: 29.3 pg (ref 26.0–34.0)
MCHC: 34.3 g/dL (ref 30.0–36.0)
MCV: 85.6 fL (ref 80.0–100.0)
Platelets: 331 10*3/uL (ref 150–400)
RBC: 4.5 MIL/uL (ref 3.87–5.11)
RDW: 12.8 % (ref 11.5–15.5)
WBC: 12.1 10*3/uL — ABNORMAL HIGH (ref 4.0–10.5)
nRBC: 0 % (ref 0.0–0.2)

## 2019-02-15 MED ORDER — ATORVASTATIN CALCIUM 40 MG PO TABS
40.0000 mg | ORAL_TABLET | Freq: Once | ORAL | Status: AC
  Administered 2019-02-15: 21:00:00 40 mg via ORAL
  Filled 2019-02-15: qty 1

## 2019-02-15 MED ORDER — QUETIAPINE FUMARATE 25 MG PO TABS
12.5000 mg | ORAL_TABLET | Freq: Two times a day (BID) | ORAL | Status: DC
  Administered 2019-02-15 – 2019-02-17 (×5): 12.5 mg via ORAL
  Filled 2019-02-15 (×5): qty 1

## 2019-02-15 MED ORDER — LABETALOL HCL 5 MG/ML IV SOLN
5.0000 mg | INTRAVENOUS | Status: DC | PRN
  Administered 2019-02-16: 09:00:00 5 mg via INTRAVENOUS
  Filled 2019-02-15: qty 4

## 2019-02-15 MED ORDER — POTASSIUM CHLORIDE CRYS ER 20 MEQ PO TBCR
40.0000 meq | EXTENDED_RELEASE_TABLET | Freq: Three times a day (TID) | ORAL | Status: DC
  Administered 2019-02-15 (×2): 40 meq via ORAL
  Filled 2019-02-15 (×3): qty 2

## 2019-02-15 NOTE — Progress Notes (Signed)
Physical Therapy Treatment Patient Details Name: Anne Hernandez MRN: [id number] DOB: Nov 30, 1929 Today's Date: 02/15/2019    History of Present Illness 85 yo admitted with aphasia and slumped in W/C at ALF [facility]) no hemorrhage on CT; MRI showing left frontal infarct. PMhx: dementia, HTN, HLD, DM    PT Comments    Patient maintaining functional mobility, ambulating from bed to chair with two person moderate assistance and walker. Requires increased time for processing and multimodal cues for tasks. Continues with poor balance, displaying occasional right lateral lean in sitting and posterior lean in standing. Continue to recommend SNF for ongoing Physical Therapy.      Follow Up Recommendations  SNF;Supervision/Assistance - 24 hour     Equipment Recommendations  None recommended by PT    Recommendations for Other Services       Precautions / Restrictions Precautions Precautions: Fall Restrictions Weight Bearing Restrictions: No    Mobility  Bed Mobility Overal bed mobility: Needs Assistance Bed Mobility: Supine to Sit     Supine to sit: Max assist     General bed mobility comments: Pt able to initiate BLE negotiation towards edge of bed with multimodal cues. Ultimately requiring maxA to progress to edge of bed due to increased distractability  Transfers Overall transfer level: Needs assistance Equipment used: Rolling walker (2 wheeled) Transfers: Sit to/from Stand Sit to Stand: +2 safety/equipment;Mod assist         General transfer comment: modA to stand from edge of bed  Ambulation/Gait Ambulation/Gait assistance: Mod assist;+2 physical assistance Gait Distance (Feet): 5 Feet Assistive device: Rolling walker (2 wheeled);2 person hand held assist Gait Pattern/deviations: Step-through pattern;Decreased stride length;Leaning posteriorly;Narrow base of support Gait velocity: decr Gait velocity interpretation: <1.31 ft/sec, indicative of household  ambulator General Gait Details: ModA for stability for ambulating from bed to chair. Pt with increased posterior lean and trunk flexed posture. Manual assist for walker negotiation   Stairs             Wheelchair Mobility    Modified Rankin (Stroke Patients Only) Modified Rankin (Stroke Patients Only) Pre-Morbid Rankin Score: Moderately severe disability Modified Rankin: Moderately severe disability     Balance Overall balance assessment: Needs assistance Sitting-balance support: Bilateral upper extremity supported Sitting balance-Leahy Scale: Fair     Standing balance support: Bilateral upper extremity supported Standing balance-Leahy Scale: Poor                              Cognition Arousal/Alertness: Lethargic Behavior During Therapy: Flat affect Overall Cognitive Status: Impaired/Different from baseline Area of Impairment: Memory;Attention;Following commands;Safety/judgement;Problem solving                   Current Attention Level: Focused   Following Commands: Follows one step commands inconsistently Safety/Judgement: Decreased awareness of safety;Decreased awareness of deficits   Problem Solving: Slow processing;Decreased initiation General Comments: Eyes closed through some of session. Very slow processing and requires physical assist for initiation      Exercises      General Comments        Pertinent Vitals/Pain Pain Assessment: No/denies pain    Home Living                      Prior Function            PT Goals (current goals can now be found in the care plan section) Acute Rehab PT Goals Potential  to Achieve Goals: Fair Progress towards PT goals: Progressing toward goals    Frequency    Min 3X/week      PT Plan Current plan remains appropriate    Co-evaluation              AM-PAC PT "6 Clicks" Mobility   Outcome Measure  Help needed turning from your back to your side while in a flat bed  without using bedrails?: A Little Help needed moving from lying on your back to sitting on the side of a flat bed without using bedrails?: A Lot Help needed moving to and from a bed to a chair (including a wheelchair)?: A Lot Help needed standing up from a chair using your arms (e.g., wheelchair or bedside chair)?: A Lot Help needed to walk in hospital room?: A Lot Help needed climbing 3-5 steps with a railing? : Total 6 Click Score: 12    End of Session Equipment Utilized During Treatment: Gait belt Activity Tolerance: Patient tolerated treatment well Patient left: in chair;with call bell/phone within reach;with chair alarm set Nurse Communication: Mobility status PT Visit Diagnosis: Other abnormalities of gait and mobility (R26.89);Muscle weakness (generalized) (M62.81);Difficulty in walking, not elsewhere classified (R26.2);Other symptoms and signs involving the nervous system (R29.898);Unsteadiness on feet (R26.81)     Time: [id number] PT Time Calculation (min) (ACUTE ONLY): 17 min  Charges:  $Therapeutic Activity: 8-22 mins                     Laurina Bustlearoline Cherylene Ferrufino, PT, DPT Acute Rehabilitation Services Pager [phone] Office [phone]   Vanetta MuldersCarloine H Garrett Mitchum 02/15/2019, 1:00 PM

## 2019-02-15 NOTE — Progress Notes (Addendum)
STROKE TEAM PROGRESS NOTE   INTERVAL HISTORY Pt agitated last night but responding to seroquel low dose and was reported slept well. This morning on waken up, she again agitated and combative, did not eat breakfast. Currently has a sitter at bedside and with both hand mittens.   Vitals:   02/15/19 0503 02/15/19 0600 02/15/19 0700 02/15/19 0730  BP: 124/71 (!) 128/58 (!) 103/43   Pulse: 60 61 (!) 59   Resp: 14 14 19    Temp:    98.4 F (36.9 C)  TempSrc:    Axillary  SpO2: 99% 96% 99%   Weight:      Height:        CBC:  Recent Labs  Lab 02/13/19 2046 02/15/19 0314  WBC 13.9* 12.1*  NEUTROABS 9.2*  --   HGB 12.1 13.2  HCT 37.3 38.5  MCV 87.1 85.6  PLT 328 331    Basic Metabolic Panel:  Recent Labs  Lab 02/13/19 2046 02/15/19 0314  NA 138 137  K 3.0* 2.8*  CL 99 104  CO2 29 24  GLUCOSE 250* 132*  BUN 19 5*  CREATININE 1.04* 0.78  CALCIUM 9.4 9.2   Lipid Panel:     Component Value Date/Time   CHOL 258 (H) 02/14/2019 0302   CHOL 254 (H) 04/12/2014 0843   TRIG 89 02/14/2019 0302   HDL 74 02/14/2019 0302   HDL 93 04/12/2014 0843   CHOLHDL 3.5 02/14/2019 0302   VLDL 18 02/14/2019 0302   LDLCALC 166 (H) 02/14/2019 0302   LDLCALC 136 (H) 04/12/2014 0843   HgbA1c:  Lab Results  Component Value Date   HGBA1C 8.3 (H) 02/14/2019   Urine Drug Screen: No results found for: LABOPIA, COCAINSCRNUR, LABBENZ, AMPHETMU, THCU, LABBARB  Alcohol Level No results found for: ETH  IMAGING Ct Angio Head W Or Wo Contrast  Result Date: 02/13/2019 CLINICAL DATA:  Initial evaluation for acute speech difficulty. EXAM: CT ANGIOGRAPHY HEAD AND NECK CT PERFUSION BRAIN TECHNIQUE: Multidetector CT imaging of the head and neck was performed using the standard protocol during bolus administration of intravenous contrast. Multiplanar CT image reconstructions and MIPs were obtained to evaluate the vascular anatomy. Carotid stenosis measurements (when applicable) are obtained utilizing NASCET  criteria, using the distal internal carotid diameter as the denominator. Multiphase CT imaging of the brain was performed following IV bolus contrast injection. Subsequent parametric perfusion maps were calculated using RAPID software. CONTRAST:  OMNIPAQUE IOHEXOL 350 MG/ML SOLN COMPARISON:  Prior CT from earlier same day. FINDINGS: CTA NECK FINDINGS Aortic arch: Visualized aortic arch of normal caliber with normal 3 vessel morphology. Mild atherosclerotic change about the aortic arch. No hemodynamically significant stenosis about the origin of the great vessels. Visualized subclavian arteries widely patent. Right carotid system: Right common carotid artery patent from its origin to the bifurcation without stenosis. Mild mixed plaque about the right bifurcation without hemodynamically significant stenosis. Right ICA widely patent to the skull base without stenosis, dissection or occlusion. Left carotid system: Left common carotid artery patent from its origin to the bifurcation without stenosis. Mild calcified plaque about the left bifurcation without hemodynamically significant stenosis. Left ICA patent from the bifurcation to the skull base without stenosis, dissection or occlusion. Vertebral arteries: Both of the vertebral arteries are hypoplastic and arise from the subclavian arteries. Vertebral arteries attenuated but patent proximally, but essentially occludes by the distal V2/V3 segments bilaterally. Vertebral arteries are occluded by the skull base. This is suspected to be chronic in nature.  Skeleton: No acute osseous finding. No discrete lytic or blastic osseous lesions. Other neck: No other acute soft tissue abnormality within the neck. No adenopathy. Chronic left maxillary sinusitis noted. Upper chest: Layering secretions present within the subglottic trachea and proximal mainstem bronchi, greater on the right. Patient likely at risk for aspiration. Visualized upper chest demonstrates no other acute  finding. Partially visualized lungs are largely clear. Review of the MIP images confirms the above findings CTA HEAD FINDINGS Anterior circulation: Petrous segments patent bilaterally. Mild scattered atheromatous plaque within the cavernous/supraclinoid ICAs without high-grade stenosis. ICA termini well perfused. A1 segments patent bilaterally. Normal anterior communicating artery. Multifocal severe segmental stenoses seen within the proximal-mid A2 segments bilaterally without occlusion. ACAs are patent to their distal aspects. Left M1 patent without high-grade stenosis. Normal left MCA bifurcation. Severe proximal M2 stenoses involving both the superior and inferior divisions, with additional advanced atheromatous irregularity and stenoses throughout the left MCA branches. Right M1 patent proximally. Short-segment moderate mid-distal right M1 stenosis (series 11, image 14). Normal right MCA bifurcation. Advanced small vessel atheromatous irregularity throughout the right MCA branches distally. Posterior circulation: Vertebral arteries occluded at the skull base. Opacification of the distal V4 segments bilaterally favored to be retrograde in nature via the anterior circulation. Right vertebral artery appears to be dominant. Right PICA patent proximally. Distal left V4 segment faintly opacified with faint perfusion of the left PICA. Basilar diminutive with focal short-segment moderate stenosis at its proximal aspect (series 9, image 123). Basilar otherwise patent to its distal aspect without high-grade stenosis. Superior cerebral arteries patent bilaterally. Left PCA supplied via the basilar as well as a prominent left posterior communicating artery. Fetal type origin of the right PCA. Focal short-segment severe stenosis at the right posterior communicating artery noted (series 8, image 85). Multifocal segmental severe stenoses seen throughout the left P2 segment. Extensive distal small vessel atheromatous  irregularity throughout the PCA branches distally. Venous sinuses: Grossly patent, although not well assessed due to arterial timing the contrast bolus. Anatomic variants: Predominant fetal type origin of the PCAs with overall diminutive vertebrobasilar system. Delayed phase: Not performed. Review of the MIP images confirms the above findings CT Brain Perfusion Findings: CBF (<30%) Volume: 0mL Perfusion (Tmax>6.0s) volume: 0mL Mismatch Volume: 0mL Infarction Location:Negative. IMPRESSION: 1. Negative CTA for emergent large vessel occlusion. 2. No evidence for acute infarct by CT perfusion. 3. Hypoplastic vertebral arteries bilaterally, occluded within the neck. Opacification of the intracranial vertebrobasilar system likely retrograde in nature via collateral flow from the anterior circulation. 4. Extensive intracranial atherosclerotic changes above, with most notable findings including severe multifocal bilateral A2, proximal M2, and left P2 stenoses. These results were communicated to Wilford Corner at 9:19 pmon 4/6/2020by text page via the [facility] messaging system. Electronically Signed   By: Rise Mu M.D.   On: 02/13/2019 21:47   Ct Angio Neck W And/or Wo Contrast  Result Date: 02/13/2019 CLINICAL DATA:  Initial evaluation for acute speech difficulty. EXAM: CT ANGIOGRAPHY HEAD AND NECK CT PERFUSION BRAIN TECHNIQUE: Multidetector CT imaging of the head and neck was performed using the standard protocol during bolus administration of intravenous contrast. Multiplanar CT image reconstructions and MIPs were obtained to evaluate the vascular anatomy. Carotid stenosis measurements (when applicable) are obtained utilizing NASCET criteria, using the distal internal carotid diameter as the denominator. Multiphase CT imaging of the brain was performed following IV bolus contrast injection. Subsequent parametric perfusion maps were calculated using RAPID software. CONTRAST:  OMNIPAQUE IOHEXOL 350 MG/ML SOLN  COMPARISON:  Prior CT from earlier same day. FINDINGS: CTA NECK FINDINGS Aortic arch: Visualized aortic arch of normal caliber with normal 3 vessel morphology. Mild atherosclerotic change about the aortic arch. No hemodynamically significant stenosis about the origin of the great vessels. Visualized subclavian arteries widely patent. Right carotid system: Right common carotid artery patent from its origin to the bifurcation without stenosis. Mild mixed plaque about the right bifurcation without hemodynamically significant stenosis. Right ICA widely patent to the skull base without stenosis, dissection or occlusion. Left carotid system: Left common carotid artery patent from its origin to the bifurcation without stenosis. Mild calcified plaque about the left bifurcation without hemodynamically significant stenosis. Left ICA patent from the bifurcation to the skull base without stenosis, dissection or occlusion. Vertebral arteries: Both of the vertebral arteries are hypoplastic and arise from the subclavian arteries. Vertebral arteries attenuated but patent proximally, but essentially occludes by the distal V2/V3 segments bilaterally. Vertebral arteries are occluded by the skull base. This is suspected to be chronic in nature. Skeleton: No acute osseous finding. No discrete lytic or blastic osseous lesions. Other neck: No other acute soft tissue abnormality within the neck. No adenopathy. Chronic left maxillary sinusitis noted. Upper chest: Layering secretions present within the subglottic trachea and proximal mainstem bronchi, greater on the right. Patient likely at risk for aspiration. Visualized upper chest demonstrates no other acute finding. Partially visualized lungs are largely clear. Review of the MIP images confirms the above findings CTA HEAD FINDINGS Anterior circulation: Petrous segments patent bilaterally. Mild scattered atheromatous plaque within the cavernous/supraclinoid ICAs without high-grade  stenosis. ICA termini well perfused. A1 segments patent bilaterally. Normal anterior communicating artery. Multifocal severe segmental stenoses seen within the proximal-mid A2 segments bilaterally without occlusion. ACAs are patent to their distal aspects. Left M1 patent without high-grade stenosis. Normal left MCA bifurcation. Severe proximal M2 stenoses involving both the superior and inferior divisions, with additional advanced atheromatous irregularity and stenoses throughout the left MCA branches. Right M1 patent proximally. Short-segment moderate mid-distal right M1 stenosis (series 11, image 14). Normal right MCA bifurcation. Advanced small vessel atheromatous irregularity throughout the right MCA branches distally. Posterior circulation: Vertebral arteries occluded at the skull base. Opacification of the distal V4 segments bilaterally favored to be retrograde in nature via the anterior circulation. Right vertebral artery appears to be dominant. Right PICA patent proximally. Distal left V4 segment faintly opacified with faint perfusion of the left PICA. Basilar diminutive with focal short-segment moderate stenosis at its proximal aspect (series 9, image 123). Basilar otherwise patent to its distal aspect without high-grade stenosis. Superior cerebral arteries patent bilaterally. Left PCA supplied via the basilar as well as a prominent left posterior communicating artery. Fetal type origin of the right PCA. Focal short-segment severe stenosis at the right posterior communicating artery noted (series 8, image 85). Multifocal segmental severe stenoses seen throughout the left P2 segment. Extensive distal small vessel atheromatous irregularity throughout the PCA branches distally. Venous sinuses: Grossly patent, although not well assessed due to arterial timing the contrast bolus. Anatomic variants: Predominant fetal type origin of the PCAs with overall diminutive vertebrobasilar system. Delayed phase: Not  performed. Review of the MIP images confirms the above findings CT Brain Perfusion Findings: CBF (<30%) Volume: 0mL Perfusion (Tmax>6.0s) volume: 0mL Mismatch Volume: 0mL Infarction Location:Negative. IMPRESSION: 1. Negative CTA for emergent large vessel occlusion. 2. No evidence for acute infarct by CT perfusion. 3. Hypoplastic vertebral arteries bilaterally, occluded within the neck. Opacification of the intracranial vertebrobasilar system likely retrograde  in nature via collateral flow from the anterior circulation. 4. Extensive intracranial atherosclerotic changes above, with most notable findings including severe multifocal bilateral A2, proximal M2, and left P2 stenoses. These results were communicated to Wilford Cornerrora at 9:19 pmon 4/6/2020by text page via the [facility] messaging system. Electronically Signed   By: Rise MuBenjamin  McClintock M.D.   On: 02/13/2019 21:47   Mr Brain Wo Contrast  Result Date: 02/14/2019 CLINICAL DATA:  Right facial droop.  History of dementia. EXAM: MRI HEAD WITHOUT CONTRAST TECHNIQUE: Multiplanar, multiecho pulse sequences of the brain and surrounding structures were obtained without intravenous contrast. COMPARISON:  Head CT, CTA, and CTP 02/13/2019 FINDINGS: Multiple sequences are moderately motion degraded. Brain: Patchy acute infarcts are present in the left corona radiata. Additional punctate acute infarcts are present more anteriorly and inferiorly in the left frontal lobe. There are numerous chronic microhemorrhages peripherally in the right greater than left cerebral hemispheres. There is also the suggestion of some sulcal hemosiderin staining in the right frontoparietal region towards the vertex which may reflect remote subarachnoid hemorrhage. Confluent T2 hyperintensities in the cerebral white matter and pons are nonspecific but compatible with severe chronic small vessel ischemic disease. There is a small chronic right occipital lobe infarct. Chronic lacunar infarcts are also  present in the basal ganglia bilaterally and left cerebellum. There is advanced cerebral atrophy. No mass, midline shift, or extra-axial fluid collection is identified. Vascular: Poor visualization of the distal vertebral arteries, more fully evaluated on the recent CTA. Skull and upper cervical spine: Unremarkable bone marrow signal. Sinuses/Orbits: Unremarkable orbits. Chronic less maxillary sinusitis with complete sinus opacification. Clear mastoid air cells. Other: None. IMPRESSION: 1. Acute left frontal lobe infarct primarily involving the corona radiata. 2. Severe chronic small vessel ischemic disease and cerebral atrophy. 3. Numerous chronic cerebral microhemorrhages and suspected remote subarachnoid hemorrhage, query cerebral amyloid angiopathy. Electronically Signed   By: Sebastian AcheAllen  Grady M.D.   On: 02/14/2019 21:39   Ct Cerebral Perfusion W Contrast  Result Date: 02/13/2019 CLINICAL DATA:  Initial evaluation for acute speech difficulty. EXAM: CT ANGIOGRAPHY HEAD AND NECK CT PERFUSION BRAIN TECHNIQUE: Multidetector CT imaging of the head and neck was performed using the standard protocol during bolus administration of intravenous contrast. Multiplanar CT image reconstructions and MIPs were obtained to evaluate the vascular anatomy. Carotid stenosis measurements (when applicable) are obtained utilizing NASCET criteria, using the distal internal carotid diameter as the denominator. Multiphase CT imaging of the brain was performed following IV bolus contrast injection. Subsequent parametric perfusion maps were calculated using RAPID software. CONTRAST:  100mL OMNIPAQUE IOHEXOL 350 MG/ML SOLN COMPARISON:  Prior CT from earlier same day. FINDINGS: CTA NECK FINDINGS Aortic arch: Visualized aortic arch of normal caliber with normal 3 vessel morphology. Mild atherosclerotic change about the aortic arch. No hemodynamically significant stenosis about the origin of the great vessels. Visualized subclavian arteries  widely patent. Right carotid system: Right common carotid artery patent from its origin to the bifurcation without stenosis. Mild mixed plaque about the right bifurcation without hemodynamically significant stenosis. Right ICA widely patent to the skull base without stenosis, dissection or occlusion. Left carotid system: Left common carotid artery patent from its origin to the bifurcation without stenosis. Mild calcified plaque about the left bifurcation without hemodynamically significant stenosis. Left ICA patent from the bifurcation to the skull base without stenosis, dissection or occlusion. Vertebral arteries: Both of the vertebral arteries are hypoplastic and arise from the subclavian arteries. Vertebral arteries attenuated but patent proximally, but essentially occludes by  the distal V2/V3 segments bilaterally. Vertebral arteries are occluded by the skull base. This is suspected to be chronic in nature. Skeleton: No acute osseous finding. No discrete lytic or blastic osseous lesions. Other neck: No other acute soft tissue abnormality within the neck. No adenopathy. Chronic left maxillary sinusitis noted. Upper chest: Layering secretions present within the subglottic trachea and proximal mainstem bronchi, greater on the right. Patient likely at risk for aspiration. Visualized upper chest demonstrates no other acute finding. Partially visualized lungs are largely clear. Review of the MIP images confirms the above findings CTA HEAD FINDINGS Anterior circulation: Petrous segments patent bilaterally. Mild scattered atheromatous plaque within the cavernous/supraclinoid ICAs without high-grade stenosis. ICA termini well perfused. A1 segments patent bilaterally. Normal anterior communicating artery. Multifocal severe segmental stenoses seen within the proximal-mid A2 segments bilaterally without occlusion. ACAs are patent to their distal aspects. Left M1 patent without high-grade stenosis. Normal left MCA  bifurcation. Severe proximal M2 stenoses involving both the superior and inferior divisions, with additional advanced atheromatous irregularity and stenoses throughout the left MCA branches. Right M1 patent proximally. Short-segment moderate mid-distal right M1 stenosis (series 11, image 14). Normal right MCA bifurcation. Advanced small vessel atheromatous irregularity throughout the right MCA branches distally. Posterior circulation: Vertebral arteries occluded at the skull base. Opacification of the distal V4 segments bilaterally favored to be retrograde in nature via the anterior circulation. Right vertebral artery appears to be dominant. Right PICA patent proximally. Distal left V4 segment faintly opacified with faint perfusion of the left PICA. Basilar diminutive with focal short-segment moderate stenosis at its proximal aspect (series 9, image 123). Basilar otherwise patent to its distal aspect without high-grade stenosis. Superior cerebral arteries patent bilaterally. Left PCA supplied via the basilar as well as a prominent left posterior communicating artery. Fetal type origin of the right PCA. Focal short-segment severe stenosis at the right posterior communicating artery noted (series 8, image 85). Multifocal segmental severe stenoses seen throughout the left P2 segment. Extensive distal small vessel atheromatous irregularity throughout the PCA branches distally. Venous sinuses: Grossly patent, although not well assessed due to arterial timing the contrast bolus. Anatomic variants: Predominant fetal type origin of the PCAs with overall diminutive vertebrobasilar system. Delayed phase: Not performed. Review of the MIP images confirms the above findings CT Brain Perfusion Findings: CBF (<30%) Volume: 0mL Perfusion (Tmax>6.0s) volume: 0mL Mismatch Volume: 0mL Infarction Location:Negative. IMPRESSION: 1. Negative CTA for emergent large vessel occlusion. 2. No evidence for acute infarct by CT perfusion. 3.  Hypoplastic vertebral arteries bilaterally, occluded within the neck. Opacification of the intracranial vertebrobasilar system likely retrograde in nature via collateral flow from the anterior circulation. 4. Extensive intracranial atherosclerotic changes above, with most notable findings including severe multifocal bilateral A2, proximal M2, and left P2 stenoses. These results were communicated to Wilford Corner at 9:19 pmon 4/6/2020by text page via the [facility] messaging system. Electronically Signed   By: Rise Mu M.D.   On: 02/13/2019 21:47   Dg Swallowing Func-speech Pathology  Result Date: 02/14/2019 Objective Swallowing Evaluation: Type of Study: MBS-Modified Barium Swallow Study  Patient Details Name: Anne Hernandez MRN: [id number] Date of Birth: 07-18-1930 Today's Date: 02/14/2019 Time: SLP Start Time (ACUTE ONLY): 1230 -SLP Stop Time (ACUTE ONLY): 1255 SLP Time Calculation (min) (ACUTE ONLY): 25 min Past Medical History: Past Medical History: Diagnosis Date . Alzheimer's disease (HCC)  . Anemia  . Cellulitis and abscess of trunk  . Disorder of bone and cartilage, unspecified  . Hyperlipidemia  . Hypertension  .  Leukocytosis, unspecified  . Muscle weakness (generalized)  . Other abnormal glucose  . Pain in joint, lower leg  . Peripheral vascular disease, unspecified (HCC)  . Senile dementia, uncomplicated (HCC)  . Type II or unspecified type diabetes mellitus without mention of complication, not stated as uncontrolled  Past Surgical History: Past Surgical History: Procedure Laterality Date . OOPHORECTOMY   . TONSILLECTOMY   HPI: 85 yo female adm to [facility] with AMS, right facial droop and aphasia. Pt also with ? aspiration at facility per RN statement *from her handoff report.  Pt with h/o dementia and is in a memory care until at facility. Pt also recently lost 3 lbs and was treated for recent UTI.  Subjective: pt awake in bed Assessment / Plan / Recommendation CHL IP CLINICAL IMPRESSIONS 02/14/2019 Clinical  Impression Patient presents with mild oropharyngeal dysphagia characterized by decreased lingual coordination/oral cohesion resulting in premature spillage of barium into pharynx.  Pharyngeal swallow timing mildly impaired allowing laryngeal penetration of thin - consistently- and nectar x1 only - due to inadequate laryngeal closure.  Pt tends to take large liquid boluses which will increase her aspiration risk with thinner liquids.  Mastication was functional and pharyngeal swallow strong with only minimal residuals inconsisently present.  Pt epiglottic position is anterior allowing boluses to spill over epiglottis rather than pooling at vallecular space.  Upon esophageal sweep, pt appeared clear. Radiologist not present to confirm.  Recommend pt start a dys3/nectar diet for maximal safety.   Given lack of aspiration, and mild penetration, clinical advancement of diet indicated.  Pt appears with anterior cervical osteophytes from C5-C6, C6-C7 but this did not impair barium flow.  SLP Visit Diagnosis Dysphagia, oropharyngeal phase (R13.12) Attention and concentration deficit following -- Frontal lobe and executive function deficit following -- Impact on safety and function Mild aspiration risk   CHL IP TREATMENT RECOMMENDATION 02/14/2019 Treatment Recommendations Therapy as outlined in treatment plan below   Prognosis 02/14/2019 Prognosis for Safe Diet Advancement Fair Barriers to Reach Goals Language deficits;Cognitive deficits;Severity of deficits Barriers/Prognosis Comment -- CHL IP DIET RECOMMENDATION 02/14/2019 SLP Diet Recommendations Dysphagia 3 (Mech soft) solids;Nectar thick liquid Liquid Administration via Cup;Straw Medication Administration Crushed with puree Compensations Slow rate;Small sips/bites Postural Changes Remain semi-upright after after feeds/meals (Comment);Seated upright at 90 degrees   CHL IP OTHER RECOMMENDATIONS 02/14/2019 Recommended Consults -- Oral Care Recommendations Oral care BID Other  Recommendations Order thickener from pharmacy   CHL IP FOLLOW UP RECOMMENDATIONS 02/14/2019 Follow up Recommendations Skilled Nursing facility   [facility] IP FREQUENCY AND DURATION 02/14/2019 Speech Therapy Frequency (ACUTE ONLY) min 1 x/week Treatment Duration 1 week      CHL IP ORAL PHASE 02/14/2019 Oral Phase Impaired Oral - Pudding Teaspoon -- Oral - Pudding Cup -- Oral - Honey Teaspoon -- Oral - Honey Cup -- Oral - Nectar Teaspoon Premature spillage;Decreased bolus cohesion;Reduced posterior propulsion;Delayed oral transit Oral - Nectar Cup Premature spillage;Decreased bolus cohesion;Reduced posterior propulsion;Weak lingual manipulation;Delayed oral transit Oral - Nectar Straw Premature spillage;Decreased bolus cohesion;Weak lingual manipulation;Delayed oral transit Oral - Thin Teaspoon Decreased bolus cohesion;Premature spillage;Delayed oral transit;Weak lingual manipulation Oral - Thin Cup Decreased bolus cohesion;Premature spillage;Reduced posterior propulsion;Delayed oral transit Oral - Thin Straw Decreased bolus cohesion;Delayed oral transit;Premature spillage;Reduced posterior propulsion Oral - Puree WFL Oral - Mech Soft WFL Oral - Regular -- Oral - Multi-Consistency -- Oral - Pill Other (Comment);Delayed oral transit;Reduced posterior propulsion Oral Phase - Comment pt masticated pill thus recommend pills be crushed  CHL IP PHARYNGEAL PHASE  02/14/2019 Pharyngeal Phase Impaired Pharyngeal- Pudding Teaspoon -- Pharyngeal -- Pharyngeal- Pudding Cup -- Pharyngeal -- Pharyngeal- Honey Teaspoon -- Pharyngeal -- Pharyngeal- Honey Cup -- Pharyngeal -- Pharyngeal- Nectar Teaspoon WFL Pharyngeal -- Pharyngeal- Nectar Cup Penetration/Aspiration during swallow Pharyngeal Material enters airway, remains ABOVE vocal cords and not ejected out Pharyngeal- Nectar Straw -- Pharyngeal -- Pharyngeal- Thin Teaspoon WFL Pharyngeal -- Pharyngeal- Thin Cup Penetration/Aspiration during swallow;Reduced laryngeal elevation;Reduced  airway/laryngeal closure Pharyngeal Material enters airway, remains ABOVE vocal cords and not ejected out;Material enters airway, CONTACTS cords and not ejected out Pharyngeal- Thin Straw Penetration/Aspiration during swallow;Reduced airway/laryngeal closure;Reduced laryngeal elevation Pharyngeal Material enters airway, CONTACTS cords and not ejected out Pharyngeal- Puree WFL Pharyngeal -- Pharyngeal- Mechanical Soft WFL Pharyngeal -- Pharyngeal- Regular -- Pharyngeal -- Pharyngeal- Multi-consistency -- Pharyngeal -- Pharyngeal- Pill WFL Pharyngeal -- Pharyngeal Comment --  CHL IP CERVICAL ESOPHAGEAL PHASE 02/14/2019 Cervical Esophageal Phase WFL Pudding Teaspoon -- Pudding Cup -- Honey Teaspoon -- Honey Cup -- Nectar Teaspoon -- Nectar Cup -- Nectar Straw -- Thin Teaspoon -- Thin Cup -- Thin Straw -- Puree -- Mechanical Soft -- Regular -- Multi-consistency -- Pill -- Cervical Esophageal Comment -- Donavan Burnet, MS [facility] SLP Acute Rehab Services Pager [phone] Office [phone] Chales Abrahams 02/14/2019, 2:24 PM              Ct Head Code Stroke Wo Contrast  Result Date: 02/13/2019 CLINICAL DATA:  Code stroke.  Right facial droop EXAM: CT HEAD WITHOUT CONTRAST TECHNIQUE: Contiguous axial images were obtained from the base of the skull through the vertex without intravenous contrast. COMPARISON:  None. FINDINGS: Brain: There is no mass, hemorrhage or extra-axial collection. There is generalized atrophy without lobar predilection. Hypodensity of the white matter is most commonly associated with chronic microvascular disease. Old bilateral basal ganglia lacunar infarcts. Is an old right occipital lobe infarct. Vascular: No abnormal hyperdensity of the major intracranial arteries or dural venous sinuses. No intracranial atherosclerosis. Skull: The visualized skull base, calvarium and extracranial soft tissues are normal. Sinuses/Orbits: Complete opacification of the left maxillary sinus. The orbits are normal.  IMPRESSION: 1. No acute intracranial hemorrhage. 2. Atrophy and advanced chronic microvascular disease. 3. ASPECTS is 10. These results were communicated to Dr. Milon Dikes at 9:00 pm on 02/13/2019 by text page via the [facility] messaging system. Electronically Signed   By: Deatra Robinson M.D.   On: 02/13/2019 21:02    PHYSICAL EXAM  Temp:  [97.5 F (36.4 C)-98.4 F (36.9 C)] 98.4 F (36.9 C) (04/08 0730) Pulse Rate:  [56-115] 59 (04/08 0700) Resp:  [10-23] 19 (04/08 0700) BP: (86-220)/(43-128) 103/43 (04/08 0700) SpO2:  [88 %-100 %] 99 % (04/08 0700)  General - cachectic, well developed, intermittent agitation and combative behavior.  Ophthalmologic - fundi not visualized due to noncooperation.  Cardiovascular - Regular rate and rhythm.  Neuro - awake, eyes open, not answer orientation questions and with word salad, paucity of speech output, not able to name but able to repeat. Moderate dysarthria. Not following commands, intermittent agitation, trying to get out bed and combative behavior with kicking and hitting. Blinking to visual threat bilaterally, EOMI, no gaze deviation, PERRL. Mild right facial droop. Tongue protrusion not cooperative. Moving all extremities spontaneously and symmetrically, BUE 4/5 at least, BLE 3/5. DTR 1+ and no babinski. Sensation, coordination and gait not tested.   ASSESSMENT/PLAN Ms. Prescilla SHAWNEE GAMBONE is a 85 y.o. female with history of dementia, hypertension, hyperlipidemia, diabetes, lives in a assisted living facility presenting with  R facial droop and inability to follow commands. Received IV tPA 02/13/2019 at 2110.   Stroke:  left corona radiata infarct s/p tPA, etiology unclear, concerning for cardioembolic source, however, not AC candidate due to CAA on MRI  Code Stroke CT head No acute stroke.      CTA head & neck no ELVO. Hypoplastic VAs occluded in neck. Extensive atherosclerosis: B A2, prox M2, L P2  CT perfusion no acute infarct  MRI  L frontal  corona radiata scattered infarct. Severe small vessel disease. Atrophy. Numerous chronic microhemorrhages and likely old  SAH, prob CAA  2D Echo EF 55%  LDL 166  HgbA1c 8.3  SCDs for VTE prophylaxis  No antithrombotic prior to admission, now on ASA  given stroke. However, recommend against ASA 325 or DAPT or AC due to MRI evidence of CAA.   Therapy recommendations:  SNF  Disposition:  pending  (from [facility])  DNR  Cerebral amyloid angiopathy  MRI evidence of CAA  Go along with her AD dementia   On ASA 81 given stroke  However, recommend against ASA 325 or DAPT or AC due to risk of ICH  Dysphagia  Secondary to storke  D3 nectar thick diet  Speech following  Dementia with behavior disturbance  Agitation with combative behavior  Seems responding to low dose seroquel  Will add seroquel 12.5mg  bid  Wean off sitter as able  Hypertension  BP as high as 208/98  Home meds:  none  Back on Cleviprex late yesterday, Off Cleviprex gtt at 0400  On amlodipine  . Long-term BP goal normotensive  Hyperlipidemia  Home meds:  none  LDL 166, goal < 70  Added lipitor 40  Continue statin at discharge  Diabetes type II Uncntrolled  Home meds:  unknown  HgbA1c 8.3, goal < 7.0  CBGs  SSI  Close PCP follow up  Other Stroke Risk Factors  Advanced age  ETOH use, advised to drink no more than 1 drink(s) a day  PVD  Other Active Problems  Baseline dementia - Alzheimer's   Hypokalemia K 3.0->2.8 - supplement - recheck in am   Hospital day # 2  This patient is critically ill due to stroke status post TPA, hypertensive urgency, diabetes, Alzheimer's disease and at significant risk of neurological worsening, death form recurrent stroke, hemorrhagic conversion, hypertensive encephalopathy, DKA, psychosis. This patient's care requires constant monitoring of vital signs, hemodynamics, respiratory and cardiac monitoring, review of multiple  databases, neurological assessment, discussion with family, other specialists and medical decision making of high complexity. I spent 35 minutes of neurocritical care time in the care of this patient.  Marvel Plan, MD PhD Stroke Neurology 02/15/2019 7:45 AM  To contact Stroke Continuity provider, please refer to [email or web address]. After hours, contact General Neurology

## 2019-02-15 NOTE — Progress Notes (Signed)
Notified MD of pt refusing PO meds. Jorge Ny

## 2019-02-15 NOTE — NC FL2 (Signed)
[place] MEDICAID [id number] LEVEL OF CARE SCREENING TOOL     IDENTIFICATION  Patient Name: Anne Hernandez Birthdate: 1930/04/04 Sex: female Admission Date (Current Location): 02/13/2019  [facility] and [place] Number:  Producer, television/film/video and Address:  The . [facility], [address], [place], [place] [address]      Provider Number: [id number]  Attending Physician Name and Address:  Marvel Plan, MD  Relative Name and Phone Number:       Current Level of Care: Hospital Recommended Level of Care: Skilled Nursing Facility Prior Approval Number:    Date Approved/Denied:   PASRR Number: [id number]  Discharge Plan: SNF    Current Diagnoses: Patient Active Problem List   Diagnosis Date Noted  . Acute ischemic stroke (HCC) 02/13/2019  . Mixed Alzheimer's and vascular dementia (HCC) 12/13/2013  . Folliculitis 12/13/2013  . Alzheimer's disease (HCC) 01/24/2013  . Hyperlipidemia 01/24/2013  . Peripheral vascular disease, unspecified (HCC) 01/24/2013  . Essential hypertension, benign 01/24/2013  . Pain in joint, lower leg 01/24/2013  . Disorder of bone and cartilage, unspecified 01/24/2013  . Diabetes (HCC) 01/24/2013  . Pruritic dermatitis 01/24/2013    Orientation RESPIRATION BLADDER Height & Weight     Self  Normal Incontinent Weight: 103 lb 6.3 oz (46.9 kg) Height:  5\' 2"  (157.5 cm)  BEHAVIORAL SYMPTOMS/MOOD NEUROLOGICAL BOWEL NUTRITION STATUS      Continent Diet(see DC summary)  AMBULATORY STATUS COMMUNICATION OF NEEDS Skin   Extensive Assist Verbally Normal                       Personal Care Assistance Level of Assistance  Bathing, Feeding, Dressing Bathing Assistance: Maximum assistance Feeding assistance: Limited assistance Dressing Assistance: Maximum assistance     Functional Limitations Info  Sight, Hearing, Speech Sight Info: Adequate Hearing Info: Adequate Speech Info: Impaired(dysarthria)    SPECIAL CARE FACTORS FREQUENCY   PT (By licensed PT), OT (By licensed OT), Speech therapy     PT Frequency: 5x/wk OT Frequency: 5x/wk     Speech Therapy Frequency: 5x/wk      Contractures Contractures Info: Not present    Additional Factors Info  Code Status, Allergies, Insulin Sliding Scale Code Status Info: DNR Allergies Info: Penicillins   Insulin Sliding Scale Info: 0-9 units 3x/day with meals       Current Medications (02/15/2019):  This is the current hospital active medication list Current Facility-Administered Medications  Medication Dose Route Frequency Provider Last Rate Last Dose  .  stroke: mapping our early stages of recovery book   Does not apply Once Milon Dikes, MD      . 0.9 %  sodium chloride infusion   Intravenous Continuous Marvel Plan, MD 40 mL/hr at 02/15/19 1300    . acetaminophen (TYLENOL) tablet 650 mg  650 mg Oral Q4H PRN Milon Dikes, MD       Or  . acetaminophen (TYLENOL) solution 650 mg  650 mg Per Tube Q4H PRN Milon Dikes, MD       Or  . acetaminophen (TYLENOL) suppository 650 mg  650 mg Rectal Q4H PRN Milon Dikes, MD      . amLODipine (NORVASC) tablet 10 mg  10 mg Oral Daily Marvel Plan, MD   10 mg at 02/15/19 1035  . aspirin EC tablet 81 mg  81 mg Oral Daily Marvel Plan, MD   81 mg at 02/15/19 1034  . atorvastatin (LIPITOR) tablet 40 mg  40 mg Oral q1800  Marvel PlanXu, Jindong, MD      . insulin aspart (novoLOG) injection 0-9 Units  0-9 Units Subcutaneous TID WC Milon DikesArora, Ashish, MD   2 Units at 02/15/19 1255  . labetalol (NORMODYNE,TRANDATE) injection 5-20 mg  5-20 mg Intravenous Q2H PRN Marvel PlanXu, Jindong, MD      . MEDLINE mouth rinse  15 mL Mouth Rinse BID Milon DikesArora, Ashish, MD   15 mL at 02/15/19 1041  . pantoprazole (PROTONIX) EC tablet 40 mg  40 mg Oral Daily Marvel PlanXu, Jindong, MD   40 mg at 02/15/19 1034  . potassium chloride SA (K-DUR,KLOR-CON) CR tablet 40 mEq  40 mEq Oral TID Layne BentonBiby, Sharon L, NP   40 mEq at 02/15/19 1034  . QUEtiapine (SEROQUEL) tablet 12.5 mg  12.5 mg Oral BID Marvel PlanXu, Jindong,  MD   12.5 mg at 02/15/19 1043  . Resource ThickenUp Clear   Oral PRN Marvel PlanXu, Jindong, MD      . senna-docusate (Senokot-S) tablet 1 tablet  1 tablet Oral QHS PRN Milon DikesArora, Ashish, MD         Discharge Medications: Please see discharge summary for a list of discharge medications.  Relevant Imaging Results:  Relevant Lab Results:   Additional Information SS#: [id number]  Baldemar LenisElizabeth M Cinsere Mizrahi, LCSW

## 2019-02-15 NOTE — Progress Notes (Signed)
  Speech Language Pathology Treatment: Dysphagia  Patient Details Name: Anne Hernandez MRN: [id number] DOB: 24-Oct-1930 Today's Date: 02/15/2019 Time: 6283-1517 SLP Time Calculation (min) (ACUTE ONLY): 12 min  Assessment / Plan / Recommendation Clinical Impression  Today pt sleepy but participative in treatment session after maximum stimulation. SLP assisted pt to eat graham cracker, Magic Cup and Cranberry Juice nectar via straw.  Pt needed total assist to hold spoon and bring to mouth but able to hold cup independently.  Subtle throat clearing after cranberry nectar consumed - that may indicate laryngeal penetration.   Do not recommend advance diet at this time due to pt's impulsivity and cognitive deficits.   Pt continues with aphasia today - with unintelligible and neologistic verbal output.  She was able to repeat socially appropriate statements with min verbal cues. She will benefit from follow up    HPI HPI: 85 yo female adm to [facility] with AMS, right facial droop and aphasia. Pt also with ? aspiration at facility per RN statement *from her handoff report.  Pt with h/o dementia and is in a memory care until at facility. Pt also recently lost 3 lbs and was treated for recent UTI.       SLP Plan  Continue with current plan of care       Recommendations  Diet recommendations: Dysphagia 3 (mechanical soft);Nectar-thick liquid Liquids provided via: Cup;Straw Medication Administration: Crushed with puree Supervision: Staff to assist with self feeding Compensations: Slow rate;Small sips/bites Postural Changes and/or Swallow Maneuvers: Seated upright 90 degrees;Upright 30-60 min after meal      Patient may use Passy-Muir Speech Valve: with SLP only         Oral Care Recommendations: Oral care QID Follow up Recommendations: Skilled Nursing facility SLP Visit Diagnosis: Aphasia (R47.01);Cognitive communication deficit (O16.073) Plan: Continue with current plan of care        GO                Chales Abrahams 02/15/2019, 11:46 AM Donavan Burnet, MS [facility] SLP Acute Rehab Services Pager [phone] Office [phone]

## 2019-02-16 ENCOUNTER — Encounter (HOSPITAL_COMMUNITY): Payer: Self-pay | Admitting: Nurse Practitioner

## 2019-02-16 ENCOUNTER — Inpatient Hospital Stay (HOSPITAL_COMMUNITY): Payer: Medicare Other

## 2019-02-16 DIAGNOSIS — I680 Cerebral amyloid angiopathy: Secondary | ICD-10-CM | POA: Diagnosis present

## 2019-02-16 DIAGNOSIS — D72829 Elevated white blood cell count, unspecified: Secondary | ICD-10-CM

## 2019-02-16 DIAGNOSIS — G309 Alzheimer's disease, unspecified: Secondary | ICD-10-CM

## 2019-02-16 DIAGNOSIS — E876 Hypokalemia: Secondary | ICD-10-CM | POA: Diagnosis not present

## 2019-02-16 DIAGNOSIS — F0150 Vascular dementia without behavioral disturbance: Secondary | ICD-10-CM

## 2019-02-16 DIAGNOSIS — I69391 Dysphagia following cerebral infarction: Secondary | ICD-10-CM

## 2019-02-16 DIAGNOSIS — E119 Type 2 diabetes mellitus without complications: Secondary | ICD-10-CM

## 2019-02-16 DIAGNOSIS — F0280 Dementia in other diseases classified elsewhere without behavioral disturbance: Secondary | ICD-10-CM

## 2019-02-16 LAB — CBC
HCT: 36.9 % (ref 36.0–46.0)
Hemoglobin: 12.5 g/dL (ref 12.0–15.0)
MCH: 29.1 pg (ref 26.0–34.0)
MCHC: 33.9 g/dL (ref 30.0–36.0)
MCV: 86 fL (ref 80.0–100.0)
Platelets: 314 10*3/uL (ref 150–400)
RBC: 4.29 MIL/uL (ref 3.87–5.11)
RDW: 13 % (ref 11.5–15.5)
WBC: 10.2 10*3/uL (ref 4.0–10.5)
nRBC: 0 % (ref 0.0–0.2)

## 2019-02-16 LAB — BASIC METABOLIC PANEL
Anion gap: 9 (ref 5–15)
BUN: 13 mg/dL (ref 8–23)
CO2: 24 mmol/L (ref 22–32)
Calcium: 9.1 mg/dL (ref 8.9–10.3)
Chloride: 107 mmol/L (ref 98–111)
Creatinine, Ser: 0.88 mg/dL (ref 0.44–1.00)
GFR calc Af Amer: >60 mL/min (ref >60)
GFR calc non Af Amer: 59 mL/min — ABNORMAL LOW (ref >60)
Glucose, Bld: 159 mg/dL — ABNORMAL HIGH (ref 70–99)
Potassium: 3.7 mmol/L (ref 3.5–5.1)
Sodium: 140 mmol/L (ref 135–145)

## 2019-02-16 LAB — GLUCOSE, CAPILLARY
Glucose-Capillary: 154 mg/dL — ABNORMAL HIGH (ref 70–99)
Glucose-Capillary: 104 mg/dL — ABNORMAL HIGH (ref 70–99)
Glucose-Capillary: 126 mg/dL — ABNORMAL HIGH (ref 70–99)
Glucose-Capillary: 177 mg/dL — ABNORMAL HIGH (ref 70–99)

## 2019-02-16 NOTE — ED Notes (Signed)
BP now 140's systolic. Per Neuro MD, initiate tPA.

## 2019-02-16 NOTE — Progress Notes (Signed)
STROKE TEAM PROGRESS NOTE   INTERVAL HISTORY Pt still has a sitter, as per report, pt not eating well, refused to eat or po meds, but after seroquel morning dose, she was able to take po meds and then worked with PT/OT a little bit before falling asleep.    Vitals:   02/15/19 2009 02/15/19 2354 02/16/19 0438 02/16/19 0902  BP: 140/64 (!) 146/72 (!) 173/91 (!) 182/56  Pulse: 76 89 98 64  Resp: 19 17 16 16   Temp: 98.2 F (36.8 C) 97.9 F (36.6 C) 97.9 F (36.6 C) 98.2 F (36.8 C)  TempSrc: Oral Oral Oral Oral  SpO2: 97% 97% 96% 99%  Weight:    46.9 kg  Height:    5\' 2"  (1.575 m)    CBC:  Recent Labs  Lab 02/13/19 2046 02/15/19 0314 02/16/19 0806  WBC 13.9* 12.1* 10.2  NEUTROABS 9.2*  --   --   HGB 12.1 13.2 12.5  HCT 37.3 38.5 36.9  MCV 87.1 85.6 86.0  PLT 328 331 314    Basic Metabolic Panel:  Recent Labs  Lab 02/15/19 0314 02/16/19 0806  NA 137 140  K 2.8* 3.7  CL 104 107  CO2 24 24  GLUCOSE 132* 159*  BUN 5* 13  CREATININE 0.78 0.88  CALCIUM 9.2 9.1   Lipid Panel:     Component Value Date/Time   CHOL 258 (H) 02/14/2019 0302   CHOL 254 (H) 04/12/2014 0843   TRIG 89 02/14/2019 0302   HDL 74 02/14/2019 0302   HDL 93 04/12/2014 0843   CHOLHDL 3.5 02/14/2019 0302   VLDL 18 02/14/2019 0302   LDLCALC 166 (H) 02/14/2019 0302   LDLCALC 136 (H) 04/12/2014 0843   HgbA1c:  Lab Results  Component Value Date   HGBA1C 8.3 (H) 02/14/2019   Urine Drug Screen: No results found for: LABOPIA, COCAINSCRNUR, LABBENZ, AMPHETMU, THCU, LABBARB  Alcohol Level No results found for: [facility]  IMAGING Mr Brain Wo Contrast  Result Date: 02/14/2019 CLINICAL DATA:  Right facial droop.  History of dementia. EXAM: MRI HEAD WITHOUT CONTRAST TECHNIQUE: Multiplanar, multiecho pulse sequences of the brain and surrounding structures were obtained without intravenous contrast. COMPARISON:  Head CT, CTA, and CTP 02/13/2019 FINDINGS: Multiple sequences are moderately motion degraded.  Brain: Patchy acute infarcts are present in the left corona radiata. Additional punctate acute infarcts are present more anteriorly and inferiorly in the left frontal lobe. There are numerous chronic microhemorrhages peripherally in the right greater than left cerebral hemispheres. There is also the suggestion of some sulcal hemosiderin staining in the right frontoparietal region towards the vertex which may reflect remote subarachnoid hemorrhage. Confluent T2 hyperintensities in the cerebral white matter and pons are nonspecific but compatible with severe chronic small vessel ischemic disease. There is a small chronic right occipital lobe infarct. Chronic lacunar infarcts are also present in the basal ganglia bilaterally and left cerebellum. There is advanced cerebral atrophy. No mass, midline shift, or extra-axial fluid collection is identified. Vascular: Poor visualization of the distal vertebral arteries, more fully evaluated on the recent CTA. Skull and upper cervical spine: Unremarkable bone marrow signal. Sinuses/Orbits: Unremarkable orbits. Chronic less maxillary sinusitis with complete sinus opacification. Clear mastoid air cells. Other: None. IMPRESSION: 1. Acute left frontal lobe infarct primarily involving the corona radiata. 2. Severe chronic small vessel ischemic disease and cerebral atrophy. 3. Numerous chronic cerebral microhemorrhages and suspected remote subarachnoid hemorrhage, query cerebral amyloid angiopathy. Electronically Signed   By: Jolaine Click.D.  On: 02/14/2019 21:39   Dg Swallowing Func-speech Pathology  Result Date: 02/14/2019 Objective Swallowing Evaluation: Type of Study: MBS-Modified Barium Swallow Study  Patient Details Name: Anne Hernandez MRN: [id number] Date of Birth: [birth year] Today's Date: 02/14/2019 Time: SLP Start Time (ACUTE ONLY): 1230 -SLP Stop Time (ACUTE ONLY): 1255 SLP Time Calculation (min) (ACUTE ONLY): 25 min Past Medical History: Past Medical History:  Diagnosis Date . Alzheimer's disease (HCC)  . Anemia  . Cellulitis and abscess of trunk  . Disorder of bone and cartilage, unspecified  . Hyperlipidemia  . Hypertension  . Leukocytosis, unspecified  . Muscle weakness (generalized)  . Other abnormal glucose  . Pain in joint, lower leg  . Peripheral vascular disease, unspecified (HCC)  . Senile dementia, uncomplicated (HCC)  . Type II or unspecified type diabetes mellitus without mention of complication, not stated as uncontrolled  Past Surgical History: Past Surgical History: Procedure Laterality Date . OOPHORECTOMY   . TONSILLECTOMY   HPI: 85 yo female adm to [facility] with AMS, right facial droop and aphasia. Pt also with ? aspiration at facility per RN statement *from her handoff report.  Pt with h/o dementia and is in a memory care until at facility. Pt also recently lost 3 lbs and was treated for recent UTI.  Subjective: pt awake in bed Assessment / Plan / Recommendation CHL IP CLINICAL IMPRESSIONS 02/14/2019 Clinical Impression Patient presents with mild oropharyngeal dysphagia characterized by decreased lingual coordination/oral cohesion resulting in premature spillage of barium into pharynx.  Pharyngeal swallow timing mildly impaired allowing laryngeal penetration of thin - consistently- and nectar x1 only - due to inadequate laryngeal closure.  Pt tends to take large liquid boluses which will increase her aspiration risk with thinner liquids.  Mastication was functional and pharyngeal swallow strong with only minimal residuals inconsisently present.  Pt epiglottic position is anterior allowing boluses to spill over epiglottis rather than pooling at vallecular space.  Upon esophageal sweep, pt appeared clear. Radiologist not present to confirm.  Recommend pt start a dys3/nectar diet for maximal safety.   Given lack of aspiration, and mild penetration, clinical advancement of diet indicated.  Pt appears with anterior cervical osteophytes from C5-C6, C6-C7 but this did  not impair barium flow.  SLP Visit Diagnosis Dysphagia, oropharyngeal phase (R13.12) Attention and concentration deficit following -- Frontal lobe and executive function deficit following -- Impact on safety and function Mild aspiration risk   CHL IP TREATMENT RECOMMENDATION 02/14/2019 Treatment Recommendations Therapy as outlined in treatment plan below   Prognosis 02/14/2019 Prognosis for Safe Diet Advancement Fair Barriers to Reach Goals Language deficits;Cognitive deficits;Severity of deficits Barriers/Prognosis Comment -- CHL IP DIET RECOMMENDATION 02/14/2019 SLP Diet Recommendations Dysphagia 3 (Mech soft) solids;Nectar thick liquid Liquid Administration via Cup;Straw Medication Administration Crushed with puree Compensations Slow rate;Small sips/bites Postural Changes Remain semi-upright after after feeds/meals (Comment);Seated upright at 90 degrees   CHL IP OTHER RECOMMENDATIONS 02/14/2019 Recommended Consults -- Oral Care Recommendations Oral care BID Other Recommendations Order thickener from pharmacy   CHL IP FOLLOW UP RECOMMENDATIONS 02/14/2019 Follow up Recommendations Skilled Nursing facility   [facility] IP FREQUENCY AND DURATION 02/14/2019 Speech Therapy Frequency (ACUTE ONLY) min 1 x/week Treatment Duration 1 week      CHL IP ORAL PHASE 02/14/2019 Oral Phase Impaired Oral - Pudding Teaspoon -- Oral - Pudding Cup -- Oral - Honey Teaspoon -- Oral - Honey Cup -- Oral - Nectar Teaspoon Premature spillage;Decreased bolus cohesion;Reduced posterior propulsion;Delayed oral transit Oral - Nectar Cup Premature spillage;Decreased bolus  cohesion;Reduced posterior propulsion;Weak lingual manipulation;Delayed oral transit Oral - Nectar Straw Premature spillage;Decreased bolus cohesion;Weak lingual manipulation;Delayed oral transit Oral - Thin Teaspoon Decreased bolus cohesion;Premature spillage;Delayed oral transit;Weak lingual manipulation Oral - Thin Cup Decreased bolus cohesion;Premature spillage;Reduced posterior  propulsion;Delayed oral transit Oral - Thin Straw Decreased bolus cohesion;Delayed oral transit;Premature spillage;Reduced posterior propulsion Oral - Puree WFL Oral - Mech Soft WFL Oral - Regular -- Oral - Multi-Consistency -- Oral - Pill Other (Comment);Delayed oral transit;Reduced posterior propulsion Oral Phase - Comment pt masticated pill thus recommend pills be crushed  CHL IP PHARYNGEAL PHASE 02/14/2019 Pharyngeal Phase Impaired Pharyngeal- Pudding Teaspoon -- Pharyngeal -- Pharyngeal- Pudding Cup -- Pharyngeal -- Pharyngeal- Honey Teaspoon -- Pharyngeal -- Pharyngeal- Honey Cup -- Pharyngeal -- Pharyngeal- Nectar Teaspoon WFL Pharyngeal -- Pharyngeal- Nectar Cup Penetration/Aspiration during swallow Pharyngeal Material enters airway, remains ABOVE vocal cords and not ejected out Pharyngeal- Nectar Straw -- Pharyngeal -- Pharyngeal- Thin Teaspoon WFL Pharyngeal -- Pharyngeal- Thin Cup Penetration/Aspiration during swallow;Reduced laryngeal elevation;Reduced airway/laryngeal closure Pharyngeal Material enters airway, remains ABOVE vocal cords and not ejected out;Material enters airway, CONTACTS cords and not ejected out Pharyngeal- Thin Straw Penetration/Aspiration during swallow;Reduced airway/laryngeal closure;Reduced laryngeal elevation Pharyngeal Material enters airway, CONTACTS cords and not ejected out Pharyngeal- Puree WFL Pharyngeal -- Pharyngeal- Mechanical Soft WFL Pharyngeal -- Pharyngeal- Regular -- Pharyngeal -- Pharyngeal- Multi-consistency -- Pharyngeal -- Pharyngeal- Pill WFL Pharyngeal -- Pharyngeal Comment --  CHL IP CERVICAL ESOPHAGEAL PHASE 02/14/2019 Cervical Esophageal Phase WFL Pudding Teaspoon -- Pudding Cup -- Honey Teaspoon -- Honey Cup -- Nectar Teaspoon -- Nectar Cup -- Nectar Straw -- Thin Teaspoon -- Thin Cup -- Thin Straw -- Puree -- Mechanical Soft -- Regular -- Multi-consistency -- Pill -- Cervical Esophageal Comment -- Donavan Burnet, MS [facility] SLP Acute Rehab Services Pager  [phone] Office [phone] Chales Abrahams 02/14/2019, 2:24 PM              2D Echocardiogram   1. The left ventricle has a visually estimated ejection fraction of 55%. The cavity size was normal. There is mildly increased left ventricular wall thickness. Left ventricular diastolic Doppler parameters are consistent with impaired relaxation. No  evidence of left ventricular regional wall motion abnormalities.  2. The right ventricle has normal systolic function. The cavity was normal. There is no increase in right ventricular wall thickness.  3. Mild calcification of the mitral valve leaflet. There is mild mitral annular calcification present. No evidence of mitral valve stenosis. Mild mitral regurgitation.  4. The aortic valve is tricuspid. Mild calcification of the aortic valve. No stenosis of the aortic valve.  5. The aortic root and ascending aorta are normal in size and structure.  6. The IVC was normal in size. Peak RV-RA gradient 16 mmHg.   PHYSICAL EXAM  Temp:  [97.9 F (36.6 C)-98.2 F (36.8 C)] 98.2 F (36.8 C) (04/09 0902) Pulse Rate:  [64-98] 64 (04/09 0902) Resp:  [16-22] 16 (04/09 0902) BP: (131-182)/(56-91) 182/56 (04/09 0902) SpO2:  [96 %-99 %] 99 % (04/09 0902) Weight:  [46.9 kg] 46.9 kg (04/09 0902)  General - cachectic, well developed, intermittent agitation and combative behavior.  Ophthalmologic - fundi not visualized due to noncooperation.  Cardiovascular - Regular rate and rhythm.  Neuro - awake, eyes open, not answer orientation questions and with word salad, paucity of speech output, not able to name but able to repeat. Moderate dysarthria. Not following commands, intermittent agitation, trying to get out bed and combative behavior with kicking and hitting. Blinking  to visual threat bilaterally, EOMI, no gaze deviation, PERRL. Mild right facial droop. Tongue protrusion not cooperative. Moving all extremities spontaneously and symmetrically, BUE 4/5 at  least, BLE 3/5. DTR 1+ and no babinski. Sensation, coordination and gait not tested.   ASSESSMENT/PLAN Ms. Sean REMINGTON SKALSKY is a 85 y.o. female with history of dementia, hypertension, hyperlipidemia, diabetes, lives in a assisted living facility presenting with R facial droop and inability to follow commands. Received IV tPA 02/13/2019 at 2110.   Stroke:  left corona radiata infarct s/p tPA, etiology unclear, concerning for cardioembolic source, however, not AC candidate due to CAA on MRI  Code Stroke CT head No acute stroke.      CTA head & neck no ELVO. Hypoplastic VAs occluded in neck. Extensive atherosclerosis: B A2, prox M2, L P2  CT perfusion no acute infarct  MRI  L frontal corona radiata scattered infarct. Severe small vessel disease. Atrophy. Numerous chronic microhemorrhages and likely old  SAH, prob CAA  2D Echo EF 55%  LDL 166  HgbA1c 8.3  SCDs for VTE prophylaxis  No antithrombotic prior to admission, now on ASA  given stroke. Recommend against ASA 325 or DAPT or AC due to MRI evidence of CAA.   Therapy recommendations:  SNF  Disposition:  pending  (from [facility] ALF)  DNR  Cerebral amyloid angiopathy  MRI evidence of CAA  Go along with her AD dementia   On ASA 81 given stroke  Recommend against ASA 325 or DAPT or AC due to risk of ICH  Dysphagia  Secondary to stroke  D3 nectar thick diet  Speech following  Encourage po intake  Dementia with behavior disturbance  Agitation with combative behavior  Seems responding well to low dose seroquel  On seroquel 12.5mg  bid  D/c sitter  Hypertension  BP fluctuates  Home meds:  none  Treated with Cleviprex in the ICU . On amlodipine to 10 mg . Long-term BP goal normotensive  Hyperlipidemia  Home meds:  none  LDL 166, goal < 70  Added lipitor 40  Continue statin at discharge  Diabetes type II Uncntrolled  Home meds:  unknown  HgbA1c 8.3, goal < 7.0  CBGs  SSI  Close  PCP follow up  Other Stroke Risk Factors  Advanced age  ETOH use, advised to drink no more than 1 drink(s) a day  PVD  Other Active Problems  Baseline dementia - Alzheimer's   Hypokalemia K 3.0->2.8 - supplement - 3.7 (resolved)  Leukocytosis WBC 13.9->12.1->10.2  Hospital day # 3   Marvel Plan, MD PhD Stroke Neurology 02/16/2019 12:13 PM  To contact Stroke Continuity provider, please refer to [email or web address]. After hours, contact General Neurology

## 2019-02-16 NOTE — Plan of Care (Signed)
Progressing towards goals

## 2019-02-16 NOTE — Progress Notes (Signed)
Occupational Therapy Treatment Patient Details Name: Anne Hernandez MRN: [id number] DOB: 16-Jan-1930 Today's Date: 02/16/2019    History of present illness 85 yo admitted with aphasia and slumped in W/C at ALF [facility] home) no hemorrhage on CT; MRI showing left frontal infarct. PMhx: dementia, HTN, HLD, DM   OT comments  Pt cooperative throughout session, trouble understanding speech today - was able to answer yes and no but anything else is garbled and can't be understood. Could not recognize a comb, and did not know what to do with it, would not allow hand over hand assist for combing or washing face. She would allow OT to brush her hair for her. Pt was max A for bed mobility, max A +2 for BSC transfer  And perianal care, then return to bed. Pt distract able, presents with continued generalized weakness and current POC remains appropriate.    Follow Up Recommendations  SNF;Supervision/Assistance - 24 hour    Equipment Recommendations  Other (comment)(defer to next venue)    Recommendations for Other Services      Precautions / Restrictions Precautions Precautions: Fall Restrictions Weight Bearing Restrictions: No       Mobility Bed Mobility Overal bed mobility: Needs Assistance Bed Mobility: Supine to Sit     Supine to sit: Max assist;+2 for safety/equipment     General bed mobility comments: Pt able to initiate BLE negotiation towards edge of bed with multimodal cues. Ultimately requiring maxA to progress to edge of bed due to increased distractability  Transfers Overall transfer level: Needs assistance Equipment used: 2 person hand held assist Transfers: Sit to/from [employer] Sit to Stand: Max assist;+2 physical assistance;+2 safety/equipment Stand pivot transfers: Max assist;+2 physical assistance;+2 safety/equipment       General transfer comment: max A +2 for initiating movements as well as for boost and pivotal steps.     Balance Overall  balance assessment: Needs assistance Sitting-balance support: Bilateral upper extremity supported Sitting balance-Leahy Scale: Fair     Standing balance support: Bilateral upper extremity supported Standing balance-Leahy Scale: Zero Standing balance comment: posterior lean with mod assist to maintain standing                           ADL either performed or assessed with clinical judgement   ADL Overall ADL's : Needs assistance/impaired     Grooming: Maximal assistance;Sitting;Wash/dry face Grooming Details (indicate cue type and reason): Pt did NOT like that I attempted hand over hand for face washing - she pushed me away - and could not complete on her own     Lower Body Bathing: Maximal assistance;Sitting/lateral leans Lower Body Bathing Details (indicate cue type and reason): to clean up urine     Lower Body Dressing: Maximal assistance;Sitting/lateral leans Lower Body Dressing Details (indicate cue type and reason): for socks               General ADL Comments: Max-Total A due to cognition and poor following of commands. Pt highly distractable     Vision       Perception     Praxis      Cognition Arousal/Alertness: Lethargic Behavior During Therapy: Flat affect Overall Cognitive Status: Impaired/Different from baseline Area of Impairment: Memory;Attention;Following commands;Safety/judgement;Problem solving                   Current Attention Level: Focused   Following Commands: Follows one step commands inconsistently Safety/Judgement: Decreased awareness of safety;Decreased  awareness of deficits   Problem Solving: Slow processing;Decreased initiation General Comments: Eyes closed through some of session. Very slow processing and requires physical assist for initiation        Exercises     Shoulder Instructions       General Comments      Pertinent Vitals/ Pain       Pain Assessment: No/denies pain Faces Pain Scale: No  hurt Pain Intervention(s): Monitored during session;Repositioned  Home Living                                          Prior Functioning/Environment              Frequency  Min 2X/week        Progress Toward Goals  OT Goals(current goals can now be found in the care plan section)  Progress towards OT goals: Progressing toward goals  Acute Rehab OT Goals Patient Stated Goal: none stated OT Goal Formulation: Patient unable to participate in goal setting Time For Goal Achievement: 02/28/19 Potential to Achieve Goals: Fair  Plan Discharge plan remains appropriate;Frequency remains appropriate    Co-evaluation    PT/OT/SLP Co-Evaluation/Treatment: Yes Reason for Co-Treatment: Complexity of the patient's impairments (multi-system involvement);For patient/therapist safety;To address functional/ADL transfers PT goals addressed during session: Mobility/safety with mobility;Balance OT goals addressed during session: ADL's and self-care      AM-PAC OT "6 Clicks" Daily Activity     Outcome Measure   Help from another person eating meals?: Total Help from another person taking care of personal grooming?: Total Help from another person toileting, which includes using toliet, bedpan, or urinal?: Total Help from another person bathing (including washing, rinsing, drying)?: Total Help from another person to put on and taking off regular upper body clothing?: Total Help from another person to put on and taking off regular lower body clothing?: Total 6 Click Score: 6    End of Session Equipment Utilized During Treatment: Gait belt  OT Visit Diagnosis: Unsteadiness on feet (R26.81);Other abnormalities of gait and mobility (R26.89);Muscle weakness (generalized) (M62.81);Other symptoms and signs involving cognitive function   Activity Tolerance Patient tolerated treatment well   Patient Left in bed;with call bell/phone within reach;with bed alarm set;with  nursing/sitter in room   Nurse Communication Mobility status        Time: [id number] OT Time Calculation (min): 28 min  Charges: OT General Charges $OT Visit: 1 Visit OT Treatments $Self Care/Home Management : 8-22 mins  Sherryl MangesLaura Sahily Biddle OTR/L Acute Rehabilitation Services Pager: [phone] Office: [phone]   Evern BioLaura J Brentyn Seehafer 02/16/2019, 10:55 AM

## 2019-02-16 NOTE — Progress Notes (Signed)
02/16/19 1240  PT Visit Information  Last PT Received On 02/16/19  Assistance Needed +2  PT/OT/SLP Co-Evaluation/Treatment Yes  Reason for Co-Treatment Complexity of the patient's impairments (multi-system involvement);For patient/therapist safety;To address functional/ADL transfers  PT goals addressed during session Mobility/safety with mobility;Balance  History of Present Illness 85 yo admitted with aphasia and slumped in W/C at ALF [facility] home) no hemorrhage on CT; MRI showing left frontal infarct. PMhx: dementia, HTN, HLD, DM  Subjective Data  Patient Stated Goal none stated  Precautions  Precautions Fall  Restrictions  Weight Bearing Restrictions No  Pain Assessment  Pain Assessment Faces  Faces Pain Scale 0  Cognition  Arousal/Alertness Lethargic  Behavior During Therapy Flat affect  Overall Cognitive Status Impaired/Different from baseline  Area of Impairment Memory;Attention;Following commands;Safety/judgement;Problem solving  Current Attention Level Focused  Memory Decreased short-term memory;Decreased recall of precautions  Following Commands Follows one step commands inconsistently  Safety/Judgement Decreased awareness of safety;Decreased awareness of deficits  Problem Solving Slow processing;Decreased initiation  General Comments Eyes closed through some of session. Very slow processing and requires physical assist for initiation  Bed Mobility  Overal bed mobility Needs Assistance  Bed Mobility Supine to Sit  Supine to sit Max assist;+2 for safety/equipment  General bed mobility comments Pt able to initiate BLE negotiation towards edge of bed with multimodal cues. Ultimately requiring maxA to progress to edge of bed due to increased distractability  Transfers  Overall transfer level Needs assistance  Equipment used 2 person hand held assist  Transfers Sit to/from [employer] Transfers  Sit to Stand Max assist;+2 physical assistance;+2 safety/equipment   Stand pivot transfers Max assist;+2 physical assistance;+2 safety/equipment  General transfer comment max A +2 for initiating movements as well as for boost and pivotal steps. Performed stand pivot transfer X2 to/from [facility].   Modified Rankin (Stroke Patients Only)  Pre-Morbid Rankin Score 4  Modified Rankin 4  Balance  Overall balance assessment Needs assistance  Sitting-balance support Bilateral upper extremity supported  Sitting balance-Leahy Scale Fair  Standing balance support Bilateral upper extremity supported  Standing balance-Leahy Scale Zero  Standing balance comment posterior lean with mod assist to maintain standing  PT - End of Session  Equipment Utilized During Treatment Gait belt  Activity Tolerance Patient tolerated treatment well  Patient left in bed;with call bell/phone within reach;with nursing/sitter in room  Nurse Communication Mobility status   PT - Assessment/Plan  PT Plan Current plan remains appropriate  PT Visit Diagnosis Other abnormalities of gait and mobility (R26.89);Muscle weakness (generalized) (M62.81);Difficulty in walking, not elsewhere classified (R26.2);Other symptoms and signs involving the nervous system (R29.898);Unsteadiness on feet (R26.81)  PT Frequency (ACUTE ONLY) Min 3X/week  Follow Up Recommendations SNF;Supervision/Assistance - 24 hour  PT equipment None recommended by PT  AM-PAC PT "6 Clicks" Mobility Outcome Measure (Version 2)  Help needed turning from your back to your side while in a flat bed without using bedrails? 1  Help needed moving from lying on your back to sitting on the side of a flat bed without using bedrails? 1  Help needed moving to and from a bed to a chair (including a wheelchair)? 1  Help needed standing up from a chair using your arms (e.g., wheelchair or bedside chair)? 1  Help needed to walk in hospital room? 1  Help needed climbing 3-5 steps with a railing?  1  6 Click Score 6  Consider Recommendation of Discharge  To: CIR/SNF/LTACH  PT Goal Progression  Progress towards PT goals  Progressing toward goals  Acute Rehab PT Goals  PT Goal Formulation Patient unable to participate in goal setting  Time For Goal Achievement 02/28/19  Potential to Achieve Goals Fair  PT Time Calculation  PT Start Time (ACUTE ONLY) 1006  PT Stop Time (ACUTE ONLY) 1034  PT Time Calculation (min) (ACUTE ONLY) 28 min  PT General Charges  $$ ACUTE PT VISIT 1 Visit  PT Treatments  $Therapeutic Activity 8-22 mins   Pt progressing slowly towards goals. Pt requiring max A +2 to perform stand pivot transfer X2 this session. Pt with difficulty sequencing and required multimodal cues for mobility tasks. Current plans remain appropriate. Will continue to follow acutely to maximize functional mobility independence and safety.   Gladys DammeBrittany Pema Thomure, PT, DPT  Acute Rehabilitation Services  Pager: [phone] Office: [phone]

## 2019-02-16 NOTE — ED Notes (Signed)
Unable to initiate tPA until pt BP less than 180. Will give labetalol and reassess.

## 2019-02-17 ENCOUNTER — Inpatient Hospital Stay (HOSPITAL_COMMUNITY): Payer: Medicare Other

## 2019-02-17 DIAGNOSIS — I739 Peripheral vascular disease, unspecified: Secondary | ICD-10-CM

## 2019-02-17 LAB — GLUCOSE, CAPILLARY
Glucose-Capillary: 158 mg/dL — ABNORMAL HIGH (ref 70–99)
Glucose-Capillary: 175 mg/dL — ABNORMAL HIGH (ref 70–99)

## 2019-02-17 LAB — CBC
HCT: 34.2 % — ABNORMAL LOW (ref 36.0–46.0)
Hemoglobin: 11.5 g/dL — ABNORMAL LOW (ref 12.0–15.0)
MCH: 29.1 pg (ref 26.0–34.0)
MCHC: 33.6 g/dL (ref 30.0–36.0)
MCV: 86.6 fL (ref 80.0–100.0)
Platelets: 283 10*3/uL (ref 150–400)
RBC: 3.95 MIL/uL (ref 3.87–5.11)
RDW: 12.8 % (ref 11.5–15.5)
WBC: 8.7 10*3/uL (ref 4.0–10.5)
nRBC: 0 % (ref 0.0–0.2)

## 2019-02-17 LAB — URINE CULTURE: Culture: >=100000 — AB

## 2019-02-17 LAB — BASIC METABOLIC PANEL
Anion gap: 12 (ref 5–15)
BUN: 12 mg/dL (ref 8–23)
CO2: 21 mmol/L — ABNORMAL LOW (ref 22–32)
Calcium: 8.9 mg/dL (ref 8.9–10.3)
Chloride: 109 mmol/L (ref 98–111)
Creatinine, Ser: 0.68 mg/dL (ref 0.44–1.00)
GFR calc Af Amer: >60 mL/min (ref >60)
GFR calc non Af Amer: >60 mL/min (ref >60)
Glucose, Bld: 138 mg/dL — ABNORMAL HIGH (ref 70–99)
Potassium: 3.5 mmol/L (ref 3.5–5.1)
Sodium: 142 mmol/L (ref 135–145)

## 2019-02-17 MED ORDER — RESOURCE THICKENUP CLEAR PO POWD: 1.0000 | ORAL | Status: AC | PRN

## 2019-02-17 MED ORDER — ATORVASTATIN CALCIUM 40 MG PO TABS: 40.0000 mg | ORAL_TABLET | Freq: Every day | ORAL | Status: AC

## 2019-02-17 MED ORDER — QUETIAPINE FUMARATE 25 MG PO TABS: 12.5000 mg | ORAL_TABLET | Freq: Two times a day (BID) | ORAL | 0 refills | Status: AC

## 2019-02-17 MED ORDER — ASPIRIN 81 MG PO TBEC: 81.0000 mg | DELAYED_RELEASE_TABLET | Freq: Every day | ORAL | Status: AC

## 2019-02-17 MED ORDER — AMLODIPINE BESYLATE 10 MG PO TABS: 10.0000 mg | ORAL_TABLET | Freq: Every day | ORAL | Status: AC

## 2019-02-17 NOTE — Progress Notes (Signed)
Physical Therapy Treatment Patient Details Name: Anne Hernandez CONNAREE Rigdon MRN: [id number] DOB: [birth year] Today's Date: 02/17/2019    History of Present Illness 85 yo admitted with aphasia and slumped in W/C at [facility] no hemorrhage on CT; MRI showing left frontal infarct. PMhx: dementia, HTN, HLD, DM    PT Comments    Pt progressing towards goals and was very agreeable to working with therapy. Ambulated to chair with mod A +2 and 2 person HHA. Feel current recommendations appropriate. Will continue to follow acutely to maximize functional mobility independence and safety.   Follow Up Recommendations  SNF;Supervision/Assistance - 24 hour     Equipment Recommendations  None recommended by PT    Recommendations for Other Services       Precautions / Restrictions Precautions Precautions: Fall Restrictions Weight Bearing Restrictions: No    Mobility  Bed Mobility Overal bed mobility: Needs Assistance Bed Mobility: Supine to Sit     Supine to sit: Mod assist;+2 for physical assistance     General bed mobility comments: Mod A +2 for LE assist and trunk elevation. PT seemed to be more engaged this session.   Transfers Overall transfer level: Needs assistance Equipment used: 2 person hand held assist Transfers: Sit to/from Stand Sit to Stand: Mod assist;+2 physical assistance         General transfer comment: Mod A +2 for lift assist and steadying.   Ambulation/Gait Ambulation/Gait assistance: Mod assist;+2 physical assistance Gait Distance (Feet): 10 Feet Assistive device: 2 person hand held assist Gait Pattern/deviations: Step-through pattern;Decreased stride length;Shuffle Gait velocity: Decreased    General Gait Details: Slow, unsteady gait. Mod A +2 for steadying assist as pt with slight posterior lean.    Stairs             Wheelchair Mobility    Modified Rankin (Stroke Patients Only) Modified Rankin (Stroke Patients Only) Pre-Morbid Rankin  Score: Moderately severe disability Modified Rankin: Moderately severe disability     Balance Overall balance assessment: Needs assistance Sitting-balance support: Bilateral upper extremity supported Sitting balance-Leahy Scale: Fair     Standing balance support: Bilateral upper extremity supported Standing balance-Leahy Scale: Poor Standing balance comment: Reliant on BUE support                             Cognition Arousal/Alertness: Awake/alert Behavior During Therapy: Flat affect Overall Cognitive Status: Impaired/Different from baseline Area of Impairment: Memory;Attention;Following commands;Safety/judgement;Problem solving                   Current Attention Level: Focused Memory: Decreased short-term memory;Decreased recall of precautions Following Commands: Follows one step commands inconsistently Safety/Judgement: Decreased awareness of safety;Decreased awareness of deficits   Problem Solving: Slow processing;Decreased initiation General Comments: Pt more alert and more agreeable to work with therapy this session.       Exercises      General Comments        Pertinent Vitals/Pain Pain Assessment: Faces Faces Pain Scale: No hurt    Home Living                      Prior Function            PT Goals (current goals can now be found in the care plan section) Acute Rehab PT Goals Patient Stated Goal: none stated PT Goal Formulation: Patient unable to participate in goal setting Time For Goal Achievement: 02/28/19 Potential to Achieve  Goals: Fair Progress towards PT goals: Progressing toward goals    Frequency    Min 3X/week      PT Plan Current plan remains appropriate    Co-evaluation              AM-PAC PT "6 Clicks" Mobility   Outcome Measure  Help needed turning from your back to your side while in a flat bed without using bedrails?: Total Help needed moving from lying on your back to sitting on the side  of a flat bed without using bedrails?: Total Help needed moving to and from a bed to a chair (including a wheelchair)?: Total Help needed standing up from a chair using your arms (e.g., wheelchair or bedside chair)?: Total Help needed to walk in hospital room?: Total Help needed climbing 3-5 steps with a railing? : Total 6 Click Score: 6    End of Session Equipment Utilized During Treatment: Gait belt Activity Tolerance: Patient tolerated treatment well Patient left: in chair;with call bell/phone within reach;with nursing/sitter in room Nurse Communication: Mobility status PT Visit Diagnosis: Other abnormalities of gait and mobility (R26.89);Muscle weakness (generalized) (M62.81);Difficulty in walking, not elsewhere classified (R26.2);Other symptoms and signs involving the nervous system (R29.898);Unsteadiness on feet (R26.81)     Time: 5366-4403 PT Time Calculation (min) (ACUTE ONLY): 16 min  Charges:  $Therapeutic Activity: 8-22 mins                     Gladys Damme, PT, DPT  Acute Rehabilitation Services  Pager: [phone] Office: [phone]    Lehman Prom 02/17/2019, 1:30 PM

## 2019-02-17 NOTE — Discharge Summary (Addendum)
Stroke Discharge Summary  Patient ID: Anne CornerMyra CONNAREE Hernandez    l   MRN: [id number]      DOB: [birth year]  Date of Admission: 02/13/2019 Date of Discharge: 02/17/2019  Attending Physician:  Marvel PlanXu, Raesean Bartoletti, MD, Stroke MD Consultant(s):     none Patient's PCP:  No primary care provider on file.  DISCHARGE DIAGNOSIS:  Principal Problem:   Acute ischemic stroke (HCC) L brain s/p tPA, embolic, not an AC candidate Active Problems:   Hyperlipidemia   Peripheral vascular disease, unspecified (HCC)   Essential hypertension, benign   Diabetes (HCC)   Mixed Alzheimer's and vascular dementia (HCC)   Cerebral amyloid angiopathy (CODE)   Dysphagia due to recent cerebrovascular accident   Hypokalemia   Past Medical History:  Diagnosis Date  . Alzheimer's disease (HCC)   . Anemia   . Cellulitis and abscess of trunk   . Disorder of bone and cartilage, unspecified   . Hyperlipidemia   . Hypertension   . Leukocytosis, unspecified   . Muscle weakness (generalized)   . Other abnormal glucose   . Pain in joint, lower leg   . Peripheral vascular disease, unspecified (HCC)   . Senile dementia, uncomplicated (HCC)   . Type II or unspecified type diabetes mellitus without mention of complication, not stated as uncontrolled    Past Surgical History:  Procedure Laterality Date  . OOPHORECTOMY    . TONSILLECTOMY      Allergies as of 02/17/2019      Reactions   Penicillins Other (See Comments)   "Allergic," per MAR (reaction??)      Medication List    STOP taking these medications   NON FORMULARY   NON FORMULARY   traZODone 50 MG tablet Commonly known as:  DESYREL     TAKE these medications   amLODipine 10 MG tablet Commonly known as:  NORVASC Take 1 tablet (10 mg total) by mouth daily. Start taking on:  February 18, 2019   aspirin 81 MG EC tablet Take 1 tablet (81 mg total) by mouth daily. Start taking on:  February 18, 2019   atorvastatin 40 MG tablet Commonly known as:  LIPITOR Take 1  tablet (40 mg total) by mouth daily at 6 PM.   QUEtiapine 25 MG tablet Commonly known as:  SEROQUEL Take 0.5 tablets (12.5 mg total) by mouth 2 (two) times daily.   Resource ThickenUp Clear Powd Take 120 g by mouth as needed (nectar thick liquids).       LABORATORY STUDIES CBC    Component Value Date/Time   WBC 8.7 02/17/2019 0300   RBC 3.95 02/17/2019 0300   HGB 11.5 (L) 02/17/2019 0300   HCT 34.2 (L) 02/17/2019 0300   PLT 283 02/17/2019 0300   MCV 86.6 02/17/2019 0300   MCH 29.1 02/17/2019 0300   MCHC 33.6 02/17/2019 0300   RDW 12.8 02/17/2019 0300   RDW 13.4 12/01/2013 0853   LYMPHSABS 3.8 02/13/2019 2046   LYMPHSABS 3.1 12/01/2013 0853   MONOABS 0.8 02/13/2019 2046   EOSABS 0.1 02/13/2019 2046   EOSABS 0.1 12/01/2013 0853   BASOSABS 0.1 02/13/2019 2046   BASOSABS 0.0 12/01/2013 0853   CMP    Component Value Date/Time   NA 142 02/17/2019 0300   NA 139 04/12/2014 0843   K 3.5 02/17/2019 0300   CL 109 02/17/2019 0300   CO2 21 (L) 02/17/2019 0300   GLUCOSE 138 (H) 02/17/2019 0300   BUN 12 02/17/2019 0300   BUN 12 04/12/2014 0843  CREATININE 0.68 02/17/2019 0300   CALCIUM 8.9 02/17/2019 0300   PROT 6.6 02/13/2019 2046   PROT 6.4 04/12/2014 0843   ALBUMIN 3.6 02/13/2019 2046   ALBUMIN 4.4 04/12/2014 0843   AST 16 02/13/2019 2046   ALT 8 02/13/2019 2046   ALKPHOS 97 02/13/2019 2046   BILITOT 0.5 02/13/2019 2046   GFRNONAA >60 02/17/2019 0300   GFRAA >60 02/17/2019 0300   COAGS Lab Results  Component Value Date   INR 1.2 02/13/2019   Lipid Panel    Component Value Date/Time   CHOL 258 (H) 02/14/2019 0302   CHOL 254 (H) 04/12/2014 0843   TRIG 89 02/14/2019 0302   HDL 74 02/14/2019 0302   HDL 93 04/12/2014 0843   CHOLHDL 3.5 02/14/2019 0302   VLDL 18 02/14/2019 0302   LDLCALC 166 (H) 02/14/2019 0302   LDLCALC 136 (H) 04/12/2014 0843   HgbA1C  Lab Results  Component Value Date   HGBA1C 8.3 (H) 02/14/2019   Urinalysis    Component Value  Date/Time   COLORURINE YELLOW 02/15/2019 1747   APPEARANCEUR TURBID (A) 02/15/2019 1747   LABSPEC 1.021 02/15/2019 1747   PHURINE 5.0 02/15/2019 1747   GLUCOSEU NEGATIVE 02/15/2019 1747   HGBUR NEGATIVE 02/15/2019 1747   BILIRUBINUR NEGATIVE 02/15/2019 1747   KETONESUR NEGATIVE 02/15/2019 1747   PROTEINUR 100 (A) 02/15/2019 1747   NITRITE NEGATIVE 02/15/2019 1747   LEUKOCYTESUR MODERATE (A) 02/15/2019 1747    SIGNIFICANT DIAGNOSTIC STUDIES Ct Angio Head W Or Wo Contrast  Result Date: 02/13/2019 CLINICAL DATA:  Initial evaluation for acute speech difficulty. EXAM: CT ANGIOGRAPHY HEAD AND NECK CT PERFUSION BRAIN TECHNIQUE: Multidetector CT imaging of the head and neck was performed using the standard protocol during bolus administration of intravenous contrast. Multiplanar CT image reconstructions and MIPs were obtained to evaluate the vascular anatomy. Carotid stenosis measurements (when applicable) are obtained utilizing NASCET criteria, using the distal internal carotid diameter as the denominator. Multiphase CT imaging of the brain was performed following IV bolus contrast injection. Subsequent parametric perfusion maps were calculated using RAPID software. CONTRAST:  OMNIPAQUE IOHEXOL 350 MG/ML SOLN COMPARISON:  Prior CT from earlier same day. FINDINGS: CTA NECK FINDINGS Aortic arch: Visualized aortic arch of normal caliber with normal 3 vessel morphology. Mild atherosclerotic change about the aortic arch. No hemodynamically significant stenosis about the origin of the great vessels. Visualized subclavian arteries widely patent. Right carotid system: Right common carotid artery patent from its origin to the bifurcation without stenosis. Mild mixed plaque about the right bifurcation without hemodynamically significant stenosis. Right ICA widely patent to the skull base without stenosis, dissection or occlusion. Left carotid system: Left common carotid artery patent from its origin to the  bifurcation without stenosis. Mild calcified plaque about the left bifurcation without hemodynamically significant stenosis. Left ICA patent from the bifurcation to the skull base without stenosis, dissection or occlusion. Vertebral arteries: Both of the vertebral arteries are hypoplastic and arise from the subclavian arteries. Vertebral arteries attenuated but patent proximally, but essentially occludes by the distal V2/V3 segments bilaterally. Vertebral arteries are occluded by the skull base. This is suspected to be chronic in nature. Skeleton: No acute osseous finding. No discrete lytic or blastic osseous lesions. Other neck: No other acute soft tissue abnormality within the neck. No adenopathy. Chronic left maxillary sinusitis noted. Upper chest: Layering secretions present within the subglottic trachea and proximal mainstem bronchi, greater on the right. Patient likely at risk for aspiration. Visualized upper chest demonstrates no  other acute finding. Partially visualized lungs are largely clear. Review of the MIP images confirms the above findings CTA HEAD FINDINGS Anterior circulation: Petrous segments patent bilaterally. Mild scattered atheromatous plaque within the cavernous/supraclinoid ICAs without high-grade stenosis. ICA termini well perfused. A1 segments patent bilaterally. Normal anterior communicating artery. Multifocal severe segmental stenoses seen within the proximal-mid A2 segments bilaterally without occlusion. ACAs are patent to their distal aspects. Left M1 patent without high-grade stenosis. Normal left MCA bifurcation. Severe proximal M2 stenoses involving both the superior and inferior divisions, with additional advanced atheromatous irregularity and stenoses throughout the left MCA branches. Right M1 patent proximally. Short-segment moderate mid-distal right M1 stenosis (series 11, image 14). Normal right MCA bifurcation. Advanced small vessel atheromatous irregularity throughout the  right MCA branches distally. Posterior circulation: Vertebral arteries occluded at the skull base. Opacification of the distal V4 segments bilaterally favored to be retrograde in nature via the anterior circulation. Right vertebral artery appears to be dominant. Right PICA patent proximally. Distal left V4 segment faintly opacified with faint perfusion of the left PICA. Basilar diminutive with focal short-segment moderate stenosis at its proximal aspect (series 9, image 123). Basilar otherwise patent to its distal aspect without high-grade stenosis. Superior cerebral arteries patent bilaterally. Left PCA supplied via the basilar as well as a prominent left posterior communicating artery. Fetal type origin of the right PCA. Focal short-segment severe stenosis at the right posterior communicating artery noted (series 8, image 85). Multifocal segmental severe stenoses seen throughout the left P2 segment. Extensive distal small vessel atheromatous irregularity throughout the PCA branches distally. Venous sinuses: Grossly patent, although not well assessed due to arterial timing the contrast bolus. Anatomic variants: Predominant fetal type origin of the PCAs with overall diminutive vertebrobasilar system. Delayed phase: Not performed. Review of the MIP images confirms the above findings CT Brain Perfusion Findings: CBF (<30%) Volume: 56mL Perfusion (Tmax>6.0s) volume: 32mL Mismatch Volume: 62mL Infarction Location:Negative. IMPRESSION: 1. Negative CTA for emergent large vessel occlusion. 2. No evidence for acute infarct by CT perfusion. 3. Hypoplastic vertebral arteries bilaterally, occluded within the neck. Opacification of the intracranial vertebrobasilar system likely retrograde in nature via collateral flow from the anterior circulation. 4. Extensive intracranial atherosclerotic changes above, with most notable findings including severe multifocal bilateral A2, proximal M2, and left P2 stenoses. These results were  communicated to Anne Hernandez at 9:19 pmon 4/6/2020by text page via the [facility] messaging system. Electronically Signed   By: Rise Mu M.D.   On: 02/13/2019 21:47   Ct Angio Neck W And/or Wo Contrast  Result Date: 02/13/2019 CLINICAL DATA:  Initial evaluation for acute speech difficulty. EXAM: CT ANGIOGRAPHY HEAD AND NECK CT PERFUSION BRAIN TECHNIQUE: Multidetector CT imaging of the head and neck was performed using the standard protocol during bolus administration of intravenous contrast. Multiplanar CT image reconstructions and MIPs were obtained to evaluate the vascular anatomy. Carotid stenosis measurements (when applicable) are obtained utilizing NASCET criteria, using the distal internal carotid diameter as the denominator. Multiphase CT imaging of the brain was performed following IV bolus contrast injection. Subsequent parametric perfusion maps were calculated using RAPID software. CONTRAST:  OMNIPAQUE IOHEXOL 350 MG/ML SOLN COMPARISON:  Prior CT from earlier same day. FINDINGS: CTA NECK FINDINGS Aortic arch: Visualized aortic arch of normal caliber with normal 3 vessel morphology. Mild atherosclerotic change about the aortic arch. No hemodynamically significant stenosis about the origin of the great vessels. Visualized subclavian arteries widely patent. Right carotid system: Right common carotid artery patent from its origin  to the bifurcation without stenosis. Mild mixed plaque about the right bifurcation without hemodynamically significant stenosis. Right ICA widely patent to the skull base without stenosis, dissection or occlusion. Left carotid system: Left common carotid artery patent from its origin to the bifurcation without stenosis. Mild calcified plaque about the left bifurcation without hemodynamically significant stenosis. Left ICA patent from the bifurcation to the skull base without stenosis, dissection or occlusion. Vertebral arteries: Both of the vertebral arteries are hypoplastic  and arise from the subclavian arteries. Vertebral arteries attenuated but patent proximally, but essentially occludes by the distal V2/V3 segments bilaterally. Vertebral arteries are occluded by the skull base. This is suspected to be chronic in nature. Skeleton: No acute osseous finding. No discrete lytic or blastic osseous lesions. Other neck: No other acute soft tissue abnormality within the neck. No adenopathy. Chronic left maxillary sinusitis noted. Upper chest: Layering secretions present within the subglottic trachea and proximal mainstem bronchi, greater on the right. Patient likely at risk for aspiration. Visualized upper chest demonstrates no other acute finding. Partially visualized lungs are largely clear. Review of the MIP images confirms the above findings CTA HEAD FINDINGS Anterior circulation: Petrous segments patent bilaterally. Mild scattered atheromatous plaque within the cavernous/supraclinoid ICAs without high-grade stenosis. ICA termini well perfused. A1 segments patent bilaterally. Normal anterior communicating artery. Multifocal severe segmental stenoses seen within the proximal-mid A2 segments bilaterally without occlusion. ACAs are patent to their distal aspects. Left M1 patent without high-grade stenosis. Normal left MCA bifurcation. Severe proximal M2 stenoses involving both the superior and inferior divisions, with additional advanced atheromatous irregularity and stenoses throughout the left MCA branches. Right M1 patent proximally. Short-segment moderate mid-distal right M1 stenosis (series 11, image 14). Normal right MCA bifurcation. Advanced small vessel atheromatous irregularity throughout the right MCA branches distally. Posterior circulation: Vertebral arteries occluded at the skull base. Opacification of the distal V4 segments bilaterally favored to be retrograde in nature via the anterior circulation. Right vertebral artery appears to be dominant. Right PICA patent proximally.  Distal left V4 segment faintly opacified with faint perfusion of the left PICA. Basilar diminutive with focal short-segment moderate stenosis at its proximal aspect (series 9, image 123). Basilar otherwise patent to its distal aspect without high-grade stenosis. Superior cerebral arteries patent bilaterally. Left PCA supplied via the basilar as well as a prominent left posterior communicating artery. Fetal type origin of the right PCA. Focal short-segment severe stenosis at the right posterior communicating artery noted (series 8, image 85). Multifocal segmental severe stenoses seen throughout the left P2 segment. Extensive distal small vessel atheromatous irregularity throughout the PCA branches distally. Venous sinuses: Grossly patent, although not well assessed due to arterial timing the contrast bolus. Anatomic variants: Predominant fetal type origin of the PCAs with overall diminutive vertebrobasilar system. Delayed phase: Not performed. Review of the MIP images confirms the above findings CT Brain Perfusion Findings: CBF (<30%) Volume: 0mL Perfusion (Tmax>6.0s) volume: 0mL Mismatch Volume: 0mL Infarction Location:Negative. IMPRESSION: 1. Negative CTA for emergent large vessel occlusion. 2. No evidence for acute infarct by CT perfusion. 3. Hypoplastic vertebral arteries bilaterally, occluded within the neck. Opacification of the intracranial vertebrobasilar system likely retrograde in nature via collateral flow from the anterior circulation. 4. Extensive intracranial atherosclerotic changes above, with most notable findings including severe multifocal bilateral A2, proximal M2, and left P2 stenoses. These results were communicated to Anne Hernandez at 9:19 pmon 4/6/2020by text page via the [facility] messaging system. Electronically Signed   By: Rise Mu M.D.   On:  02/13/2019 21:47   Mr Brain Wo Contrast  Result Date: 02/14/2019 CLINICAL DATA:  Right facial droop.  History of dementia. EXAM: MRI HEAD WITHOUT  CONTRAST TECHNIQUE: Multiplanar, multiecho pulse sequences of the brain and surrounding structures were obtained without intravenous contrast. COMPARISON:  Head CT, CTA, and CTP 02/13/2019 FINDINGS: Multiple sequences are moderately motion degraded. Brain: Patchy acute infarcts are present in the left corona radiata. Additional punctate acute infarcts are present more anteriorly and inferiorly in the left frontal lobe. There are numerous chronic microhemorrhages peripherally in the right greater than left cerebral hemispheres. There is also the suggestion of some sulcal hemosiderin staining in the right frontoparietal region towards the vertex which may reflect remote subarachnoid hemorrhage. Confluent T2 hyperintensities in the cerebral white matter and pons are nonspecific but compatible with severe chronic small vessel ischemic disease. There is a small chronic right occipital lobe infarct. Chronic lacunar infarcts are also present in the basal ganglia bilaterally and left cerebellum. There is advanced cerebral atrophy. No mass, midline shift, or extra-axial fluid collection is identified. Vascular: Poor visualization of the distal vertebral arteries, more fully evaluated on the recent CTA. Skull and upper cervical spine: Unremarkable bone marrow signal. Sinuses/Orbits: Unremarkable orbits. Chronic less maxillary sinusitis with complete sinus opacification. Clear mastoid air cells. Other: None. IMPRESSION: 1. Acute left frontal lobe infarct primarily involving the corona radiata. 2. Severe chronic small vessel ischemic disease and cerebral atrophy. 3. Numerous chronic cerebral microhemorrhages and suspected remote subarachnoid hemorrhage, query cerebral amyloid angiopathy. Electronically Signed   By: Sebastian Ache M.D.   On: 02/14/2019 21:39   Ct Cerebral Perfusion W Contrast  Result Date: 02/13/2019 CLINICAL DATA:  Initial evaluation for acute speech difficulty. EXAM: CT ANGIOGRAPHY HEAD AND NECK CT PERFUSION  BRAIN TECHNIQUE: Multidetector CT imaging of the head and neck was performed using the standard protocol during bolus administration of intravenous contrast. Multiplanar CT image reconstructions and MIPs were obtained to evaluate the vascular anatomy. Carotid stenosis measurements (when applicable) are obtained utilizing NASCET criteria, using the distal internal carotid diameter as the denominator. Multiphase CT imaging of the brain was performed following IV bolus contrast injection. Subsequent parametric perfusion maps were calculated using RAPID software. CONTRAST:  OMNIPAQUE IOHEXOL 350 MG/ML SOLN COMPARISON:  Prior CT from earlier same day. FINDINGS: CTA NECK FINDINGS Aortic arch: Visualized aortic arch of normal caliber with normal 3 vessel morphology. Mild atherosclerotic change about the aortic arch. No hemodynamically significant stenosis about the origin of the great vessels. Visualized subclavian arteries widely patent. Right carotid system: Right common carotid artery patent from its origin to the bifurcation without stenosis. Mild mixed plaque about the right bifurcation without hemodynamically significant stenosis. Right ICA widely patent to the skull base without stenosis, dissection or occlusion. Left carotid system: Left common carotid artery patent from its origin to the bifurcation without stenosis. Mild calcified plaque about the left bifurcation without hemodynamically significant stenosis. Left ICA patent from the bifurcation to the skull base without stenosis, dissection or occlusion. Vertebral arteries: Both of the vertebral arteries are hypoplastic and arise from the subclavian arteries. Vertebral arteries attenuated but patent proximally, but essentially occludes by the distal V2/V3 segments bilaterally. Vertebral arteries are occluded by the skull base. This is suspected to be chronic in nature. Skeleton: No acute osseous finding. No discrete lytic or blastic osseous lesions. Other  neck: No other acute soft tissue abnormality within the neck. No adenopathy. Chronic left maxillary sinusitis noted. Upper chest: Layering secretions present within the  subglottic trachea and proximal mainstem bronchi, greater on the right. Patient likely at risk for aspiration. Visualized upper chest demonstrates no other acute finding. Partially visualized lungs are largely clear. Review of the MIP images confirms the above findings CTA HEAD FINDINGS Anterior circulation: Petrous segments patent bilaterally. Mild scattered atheromatous plaque within the cavernous/supraclinoid ICAs without high-grade stenosis. ICA termini well perfused. A1 segments patent bilaterally. Normal anterior communicating artery. Multifocal severe segmental stenoses seen within the proximal-mid A2 segments bilaterally without occlusion. ACAs are patent to their distal aspects. Left M1 patent without high-grade stenosis. Normal left MCA bifurcation. Severe proximal M2 stenoses involving both the superior and inferior divisions, with additional advanced atheromatous irregularity and stenoses throughout the left MCA branches. Right M1 patent proximally. Short-segment moderate mid-distal right M1 stenosis (series 11, image 14). Normal right MCA bifurcation. Advanced small vessel atheromatous irregularity throughout the right MCA branches distally. Posterior circulation: Vertebral arteries occluded at the skull base. Opacification of the distal V4 segments bilaterally favored to be retrograde in nature via the anterior circulation. Right vertebral artery appears to be dominant. Right PICA patent proximally. Distal left V4 segment faintly opacified with faint perfusion of the left PICA. Basilar diminutive with focal short-segment moderate stenosis at its proximal aspect (series 9, image 123). Basilar otherwise patent to its distal aspect without high-grade stenosis. Superior cerebral arteries patent bilaterally. Left PCA supplied via the basilar  as well as a prominent left posterior communicating artery. Fetal type origin of the right PCA. Focal short-segment severe stenosis at the right posterior communicating artery noted (series 8, image 85). Multifocal segmental severe stenoses seen throughout the left P2 segment. Extensive distal small vessel atheromatous irregularity throughout the PCA branches distally. Venous sinuses: Grossly patent, although not well assessed due to arterial timing the contrast bolus. Anatomic variants: Predominant fetal type origin of the PCAs with overall diminutive vertebrobasilar system. Delayed phase: Not performed. Review of the MIP images confirms the above findings CT Brain Perfusion Findings: CBF (<30%) Volume: 0mL Perfusion (Tmax>6.0s) volume: 0mL Mismatch Volume: 0mL Infarction Location:Negative. IMPRESSION: 1. Negative CTA for emergent large vessel occlusion. 2. No evidence for acute infarct by CT perfusion. 3. Hypoplastic vertebral arteries bilaterally, occluded within the neck. Opacification of the intracranial vertebrobasilar system likely retrograde in nature via collateral flow from the anterior circulation. 4. Extensive intracranial atherosclerotic changes above, with most notable findings including severe multifocal bilateral A2, proximal M2, and left P2 stenoses. These results were communicated to Anne Hernandez at 9:19 pmon 4/6/2020by text page via the [facility] messaging system. Electronically Signed   By: Rise Mu M.D.   On: 02/13/2019 21:47   Dg Chest Port 1 View  Result Date: 02/17/2019 CLINICAL DATA:  Leukocytosis EXAM: PORTABLE CHEST 1 VIEW COMPARISON:  None. FINDINGS: Heart and mediastinal contours are within normal limits. No focal opacities or effusions. No acute bony abnormality. IMPRESSION: No active disease. Electronically Signed   By: Charlett Nose M.D.   On: 02/17/2019 09:55   Dg Swallowing Func-speech Pathology  Result Date: 02/14/2019 Objective Swallowing Evaluation: Type of Study:  MBS-Modified Barium Swallow Study  Patient Details Name: Anne Hernandez MRN: [id number] Date of Birth: 01-Jul-1930 Today's Date: 02/14/2019 Time: SLP Start Time (ACUTE ONLY): 1230 -SLP Stop Time (ACUTE ONLY): 1255 SLP Time Calculation (min) (ACUTE ONLY): 25 min Past Medical History: Past Medical History: Diagnosis Date . Alzheimer's disease (HCC)  . Anemia  . Cellulitis and abscess of trunk  . Disorder of bone and cartilage, unspecified  . Hyperlipidemia  . Hypertension  .  Leukocytosis, unspecified  . Muscle weakness (generalized)  . Other abnormal glucose  . Pain in joint, lower leg  . Peripheral vascular disease, unspecified (HCC)  . Senile dementia, uncomplicated (HCC)  . Type II or unspecified type diabetes mellitus without mention of complication, not stated as uncontrolled  Past Surgical History: Past Surgical History: Procedure Laterality Date . OOPHORECTOMY   . TONSILLECTOMY   HPI: 85 yo female adm to [facility] with AMS, right facial droop and aphasia. Pt also with ? aspiration at facility per RN statement *from her handoff report.  Pt with h/o dementia and is in a memory care until at facility. Pt also recently lost 3 lbs and was treated for recent UTI.  Subjective: pt awake in bed Assessment / Plan / Recommendation CHL IP CLINICAL IMPRESSIONS 02/14/2019 Clinical Impression Patient presents with mild oropharyngeal dysphagia characterized by decreased lingual coordination/oral cohesion resulting in premature spillage of barium into pharynx.  Pharyngeal swallow timing mildly impaired allowing laryngeal penetration of thin - consistently- and nectar x1 only - due to inadequate laryngeal closure.  Pt tends to take large liquid boluses which will increase her aspiration risk with thinner liquids.  Mastication was functional and pharyngeal swallow strong with only minimal residuals inconsisently present.  Pt epiglottic position is anterior allowing boluses to spill over epiglottis rather than pooling at vallecular space.   Upon esophageal sweep, pt appeared clear. Radiologist not present to confirm.  Recommend pt start a dys3/nectar diet for maximal safety.   Given lack of aspiration, and mild penetration, clinical advancement of diet indicated.  Pt appears with anterior cervical osteophytes from C5-C6, C6-C7 but this did not impair barium flow.  SLP Visit Diagnosis Dysphagia, oropharyngeal phase (R13.12) Attention and concentration deficit following -- Frontal lobe and executive function deficit following -- Impact on safety and function Mild aspiration risk   CHL IP TREATMENT RECOMMENDATION 02/14/2019 Treatment Recommendations Therapy as outlined in treatment plan below   Prognosis 02/14/2019 Prognosis for Safe Diet Advancement Fair Barriers to Reach Goals Language deficits;Cognitive deficits;Severity of deficits Barriers/Prognosis Comment -- CHL IP DIET RECOMMENDATION 02/14/2019 SLP Diet Recommendations Dysphagia 3 (Mech soft) solids;Nectar thick liquid Liquid Administration via Cup;Straw Medication Administration Crushed with puree Compensations Slow rate;Small sips/bites Postural Changes Remain semi-upright after after feeds/meals (Comment);Seated upright at 90 degrees   CHL IP OTHER RECOMMENDATIONS 02/14/2019 Recommended Consults -- Oral Care Recommendations Oral care BID Other Recommendations Order thickener from pharmacy   CHL IP FOLLOW UP RECOMMENDATIONS 02/14/2019 Follow up Recommendations Skilled Nursing facility   [facility] IP FREQUENCY AND DURATION 02/14/2019 Speech Therapy Frequency (ACUTE ONLY) min 1 x/week Treatment Duration 1 week      CHL IP ORAL PHASE 02/14/2019 Oral Phase Impaired Oral - Pudding Teaspoon -- Oral - Pudding Cup -- Oral - Honey Teaspoon -- Oral - Honey Cup -- Oral - Nectar Teaspoon Premature spillage;Decreased bolus cohesion;Reduced posterior propulsion;Delayed oral transit Oral - Nectar Cup Premature spillage;Decreased bolus cohesion;Reduced posterior propulsion;Weak lingual manipulation;Delayed oral transit Oral -  Nectar Straw Premature spillage;Decreased bolus cohesion;Weak lingual manipulation;Delayed oral transit Oral - Thin Teaspoon Decreased bolus cohesion;Premature spillage;Delayed oral transit;Weak lingual manipulation Oral - Thin Cup Decreased bolus cohesion;Premature spillage;Reduced posterior propulsion;Delayed oral transit Oral - Thin Straw Decreased bolus cohesion;Delayed oral transit;Premature spillage;Reduced posterior propulsion Oral - Puree WFL Oral - Mech Soft WFL Oral - Regular -- Oral - Multi-Consistency -- Oral - Pill Other (Comment);Delayed oral transit;Reduced posterior propulsion Oral Phase - Comment pt masticated pill thus recommend pills be crushed  CHL IP PHARYNGEAL PHASE  02/14/2019 Pharyngeal Phase Impaired Pharyngeal- Pudding Teaspoon -- Pharyngeal -- Pharyngeal- Pudding Cup -- Pharyngeal -- Pharyngeal- Honey Teaspoon -- Pharyngeal -- Pharyngeal- Honey Cup -- Pharyngeal -- Pharyngeal- Nectar Teaspoon WFL Pharyngeal -- Pharyngeal- Nectar Cup Penetration/Aspiration during swallow Pharyngeal Material enters airway, remains ABOVE vocal cords and not ejected out Pharyngeal- Nectar Straw -- Pharyngeal -- Pharyngeal- Thin Teaspoon WFL Pharyngeal -- Pharyngeal- Thin Cup Penetration/Aspiration during swallow;Reduced laryngeal elevation;Reduced airway/laryngeal closure Pharyngeal Material enters airway, remains ABOVE vocal cords and not ejected out;Material enters airway, CONTACTS cords and not ejected out Pharyngeal- Thin Straw Penetration/Aspiration during swallow;Reduced airway/laryngeal closure;Reduced laryngeal elevation Pharyngeal Material enters airway, CONTACTS cords and not ejected out Pharyngeal- Puree WFL Pharyngeal -- Pharyngeal- Mechanical Soft WFL Pharyngeal -- Pharyngeal- Regular -- Pharyngeal -- Pharyngeal- Multi-consistency -- Pharyngeal -- Pharyngeal- Pill WFL Pharyngeal -- Pharyngeal Comment --  CHL IP CERVICAL ESOPHAGEAL PHASE 02/14/2019 Cervical Esophageal Phase WFL Pudding Teaspoon --  Pudding Cup -- Honey Teaspoon -- Honey Cup -- Nectar Teaspoon -- Nectar Cup -- Nectar Straw -- Thin Teaspoon -- Thin Cup -- Thin Straw -- Puree -- Mechanical Soft -- Regular -- Multi-consistency -- Pill -- Cervical Esophageal Comment -- Donavan Burnet, MS [facility] SLP Acute Rehab Services Pager [phone] Office [phone] Chales Abrahams 02/14/2019, 2:24 PM              Ct Head Code Stroke Wo Contrast  Result Date: 02/13/2019 CLINICAL DATA:  Code stroke.  Right facial droop EXAM: CT HEAD WITHOUT CONTRAST TECHNIQUE: Contiguous axial images were obtained from the base of the skull through the vertex without intravenous contrast. COMPARISON:  None. FINDINGS: Brain: There is no mass, hemorrhage or extra-axial collection. There is generalized atrophy without lobar predilection. Hypodensity of the white matter is most commonly associated with chronic microvascular disease. Old bilateral basal ganglia lacunar infarcts. Is an old right occipital lobe infarct. Vascular: No abnormal hyperdensity of the major intracranial arteries or dural venous sinuses. No intracranial atherosclerosis. Skull: The visualized skull base, calvarium and extracranial soft tissues are normal. Sinuses/Orbits: Complete opacification of the left maxillary sinus. The orbits are normal. IMPRESSION: 1. No acute intracranial hemorrhage. 2. Atrophy and advanced chronic microvascular disease. 3. ASPECTS is 10. These results were communicated to Dr. Milon Dikes at 9:00 pm on 02/13/2019 by text page via the [facility] messaging system. Electronically Signed   By: Deatra Robinson M.D.   On: 02/13/2019 21:02    2D Echocardiogram  1. The left ventricle has a visually estimated ejection fraction of 55%. The cavity size was normal. There is mildly increased left ventricular wall thickness. Left ventricular diastolic Doppler parameters are consistent with impaired relaxation. No  evidence of left ventricular regional wall motion abnormalities. 2. The right  ventricle has normal systolic function. The cavity was normal. There is no increase in right ventricular wall thickness. 3. Mild calcification of the mitral valve leaflet. There is mild mitral annular calcification present. No evidence of mitral valve stenosis. Mild mitral regurgitation. 4. The aortic valve is tricuspid. Mild calcification of the aortic valve. No stenosis of the aortic valve. 5. The aortic root and ascending aorta are normal in size and structure. 6. The IVC was normal in size. Peak RV-RA gradient 16 mmHg.  HISTORY OF PRESENT ILLNESS Anne Hernandez is a 85 y.o. female past medical history of dementia, hypertension, hyperlipidemia, diabetes, lives in a assisted living facility, where she was last normal seen at 6:45 PM when she was given a bath today 02/13/2019.  At some point after  that, she was found slumped in the chair, with right facial droop and inability to follow commands.She was brought in as an acute code stroke and seen in the emergency department.  She is not able to provide any history. Noncontrast CT of the head was done was unremarkable for any acute changes.  Showed chronic accelerated white matter disease and atrophy. No evidence of bleed. No family at bedside. Given the patient's history provided by the facility, current presentation consistent with acute code stroke, no contraindications known for IV TPA per EMS->IV TPA was administered. Premorbid modified Rankin scale (mRS): 4. She was admitted to the neuro ICU.     HOSPITAL COURSE Ms. Anne Hernandez is a 85 y.o. female with history of dementia, hypertension, hyperlipidemia, diabetes, lives in a assisted living facility presenting with R facial droop and inability to follow commands. Received IV tPA 02/13/2019 at 2110.   Stroke:  left corona radiata infarct s/p tPA, etiology unclear, concerning for cardioembolic source, however, not AC candidate due to CAA on MRI  Code Stroke CT head No acute stroke.       CTA head & neck no ELVO. Hypoplastic VAs occluded in neck. Extensive atherosclerosis: B A2, prox M2, L P2  CT perfusion no acute infarct  MRI  L frontal corona radiata scattered infarct. Severe small vessel disease. Atrophy. Numerous chronic microhemorrhages and likely old  SAH, prob CAA  2D Echo EF 55%  LDL 166  HgbA1c 8.3  No antithrombotic prior to admission, now on ASA 81mg  given stroke. Recommend against ASA 325 or DAPT or AC due to MRI evidence of CAA.   Therapy recommendations:  SNF  Disposition:  SNF (from [facility])  DNR  Cerebral amyloid angiopathy  MRI evidence of CAA  Go along with her AD dementia   On ASA 81 given stroke  Recommend against ASA 325 or DAPT or AC due to risk of ICH  Dysphagia  Secondary to stroke  D3 nectar thick diet, eating 50% of meal  Encourage po intake  Dementia with behavior disturbance  Agitation with combative behavior  Seems responding well to low dose seroquel  On seroquel 12.5mg  bid, continue on discharge  Hypertension  BP fluctuates  Home meds:  none  Treated with Cleviprex in the ICU  On amlodipine to 10 mg  Long-term BP goal normotensive  Hyperlipidemia  Home meds:  none  LDL 166, goal < 70  Added lipitor 40  Continue statin at discharge  Diabetes type II Uncntrolled  Home meds:  unknown  HgbA1c 8.3, goal < 7.0  CBGs  Close PCP follow up  Other Stroke Risk Factors  Advanced age  ETOH use, advised to drink no more than 1 drink(s) a day  PVD  Other Active Problems  Baseline dementia - Alzheimer's   Hypokalemia, resolved K 3.0->2.8 - supplement - 3.5  Leukocytosis, resolved WBC 13.9->12.1->10.2->8.7   DISCHARGE EXAM Blood pressure (!) 192/88, pulse 81, temperature (!) 97.5 F (36.4 C), temperature source Axillary, resp. rate 18, height 5\' 2"  (1.575 m), weight 46.9 kg, SpO2 100 %.   General - cachectic, well developed, not in acute distress.  Ophthalmologic -  fundi not visualized due to noncooperation.  Cardiovascular - Regular rate and rhythm.  Neuro - awake, eyes open, not answer orientation questions and with word salad, paucity of speech output, not able to name but able to repeat. Moderate dysarthria. Not following commands. Blinking to visual threat bilaterally, EOMI, no gaze deviation, PERRL. Mild right facial  droop. Tongue protrusion not cooperative. Moving all extremities spontaneously and symmetrically, BUE 4/5 at least, BLE 3/5. DTR 1+ and no babinski. Sensation, coordination and gait not tested.  Discharge Diet   Dysphagia 3 nectar thick liquids  DISCHARGE PLAN  Disposition:  Skilled nursing facility for ongoing PT, OT and ST.   aspirin 81 mg daily for secondary stroke prevention. Do not recommend increase in dose or addition of other antiplatelet given cerebral amyloid dx  Ongoing stroke risk factor control by Primary Care Physician at time of discharge  Follow-up primary care provider or MD at SNF in 2 weeks.  Follow-up in [facility] in 4 weeks, office to schedule an appointment.   35 minutes were spent preparing discharge.  Marvel Plan, MD PhD Stroke Neurology 02/17/2019 12:48 PM

## 2019-02-17 NOTE — Care Management Important Message (Signed)
Important Message  Patient Details  Name: Anne Hernandez MRN: [id number] Date of Birth: 1930/03/15   Medicare Important Message Given:  Yes    Lew Prout 02/17/2019, 2:51 PM

## 2019-02-17 NOTE — TOC Transition Note (Signed)
Transition of Care [facility]) - CM/SW Discharge Note   Patient Details  Name: Anne Hernandez MRN: [id number] Date of Birth: 1930-10-14  [facility] CM/SW Contact:  Baldemar Lenis, LCSW Phone Number: 02/17/2019, 11:38 AM   Clinical Narrative:  Nurse to call report to [phone], Room 609A      Final next level of care: Skilled Nursing Facility Barriers to Discharge: No Barriers Identified   Patient Goals and CMS Choice Patient states their goals for this hospitalization and ongoing recovery are:: patient unable to participate in goal setting CMS Medicare.gov Compare Post Acute Care list provided to:: Patient Represenative (must comment) Choice offered to / list presented to : [facility] POA / Guardian  Discharge Placement              Patient chooses bed at: [facility] Patient to be transferred to facility by: [facility] Name of family member notified: Eber Jones  Patient and family notified of of transfer: 02/17/19  Discharge Plan and Services     Post Acute Care Choice: Nursing Home                    Social Determinants of Health (SDOH) Interventions     Readmission Risk Interventions No flowsheet data found.

## 2019-02-17 NOTE — Plan of Care (Signed)
Adequate for discharge.

## 2019-02-20 DIAGNOSIS — R1310 Dysphagia, unspecified: Secondary | ICD-10-CM | POA: Diagnosis not present

## 2019-02-20 DIAGNOSIS — G464 Cerebellar stroke syndrome: Secondary | ICD-10-CM | POA: Diagnosis not present

## 2019-02-20 DIAGNOSIS — I680 Cerebral amyloid angiopathy: Secondary | ICD-10-CM | POA: Diagnosis not present

## 2019-02-20 DIAGNOSIS — F0151 Vascular dementia with behavioral disturbance: Secondary | ICD-10-CM | POA: Diagnosis not present

## 2019-02-21 DIAGNOSIS — R54 Age-related physical debility: Secondary | ICD-10-CM | POA: Diagnosis not present

## 2019-02-21 DIAGNOSIS — I69398 Other sequelae of cerebral infarction: Secondary | ICD-10-CM | POA: Diagnosis not present

## 2019-02-21 DIAGNOSIS — I680 Cerebral amyloid angiopathy: Secondary | ICD-10-CM | POA: Diagnosis not present

## 2019-02-21 DIAGNOSIS — R1310 Dysphagia, unspecified: Secondary | ICD-10-CM | POA: Diagnosis not present

## 2019-02-24 DIAGNOSIS — B370 Candidal stomatitis: Secondary | ICD-10-CM | POA: Diagnosis not present

## 2019-02-24 DIAGNOSIS — R1310 Dysphagia, unspecified: Secondary | ICD-10-CM | POA: Diagnosis not present

## 2019-02-24 DIAGNOSIS — I680 Cerebral amyloid angiopathy: Secondary | ICD-10-CM | POA: Diagnosis not present

## 2019-02-24 DIAGNOSIS — I69398 Other sequelae of cerebral infarction: Secondary | ICD-10-CM | POA: Diagnosis not present

## 2019-02-27 DIAGNOSIS — I680 Cerebral amyloid angiopathy: Secondary | ICD-10-CM | POA: Diagnosis not present

## 2019-02-27 DIAGNOSIS — I69398 Other sequelae of cerebral infarction: Secondary | ICD-10-CM | POA: Diagnosis not present

## 2019-02-27 DIAGNOSIS — R54 Age-related physical debility: Secondary | ICD-10-CM | POA: Diagnosis not present

## 2019-02-27 DIAGNOSIS — R1310 Dysphagia, unspecified: Secondary | ICD-10-CM | POA: Diagnosis not present

## 2019-03-06 ENCOUNTER — Non-Acute Institutional Stay: Payer: Self-pay | Admitting: Internal Medicine

## 2019-03-06 DIAGNOSIS — Z515 Encounter for palliative care: Secondary | ICD-10-CM

## 2019-03-10 DEATH — deceased

## 2019-03-23 NOTE — Progress Notes (Signed)
    Therapist, nutritional Palliative Care Consult Note Telephone: [phone]  Fax: [phone]  PATIENT NAME: Anne Hernandez DOB: [birth year] MRN: [id number]  PRIMARY CARE PROVIDER:   Dr. Jethro Bastos REFERRING PROVIDER: Lytle Butte, NP   NOTE: Late Entry Plans for initial consult evaluation by Palliative care today.  Patient was referred to Hospice Care prior to this appointment and expired prior to evaluation by Hospice.   Discharge from [employer] census.   [name], NP-C

## 2021-02-13 IMAGING — CT CT HEAD CODE STROKE W/O CM
4 series · 15 of 47 positions shown, 17 images · non-contrast
Comparison: None.

CLINICAL DATA: Code stroke.  Right facial droop

EXAM:
CT HEAD WITHOUT CONTRAST
TECHNIQUE: Contiguous axial images were obtained from the base of the skull
through the vertex without intravenous contrast.

[Series 3: head wo · axial · 0.46mm/px · z∈[-172,-46]mm · 7 of 35 slices shown, 9 images]
[im 5/35  brain]
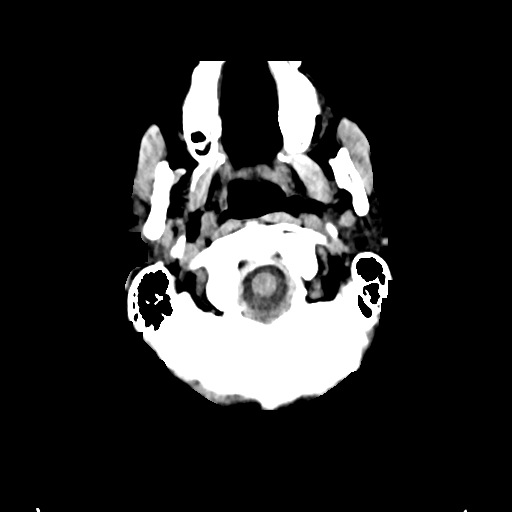
[im 5/35  bone]
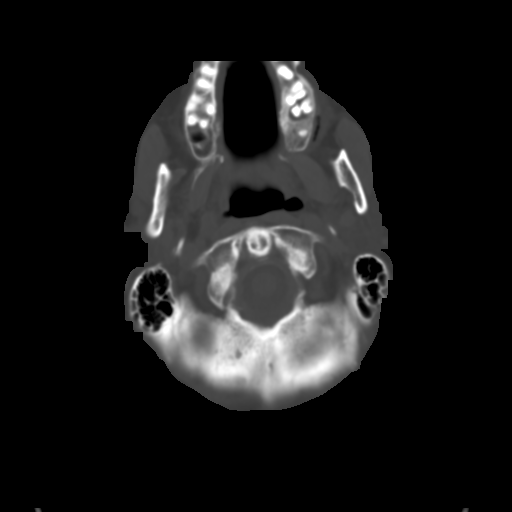
[im 9/35  brain]
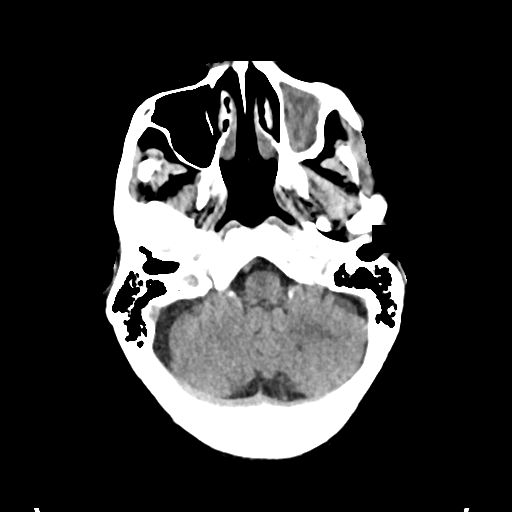
[im 13/35  brain]
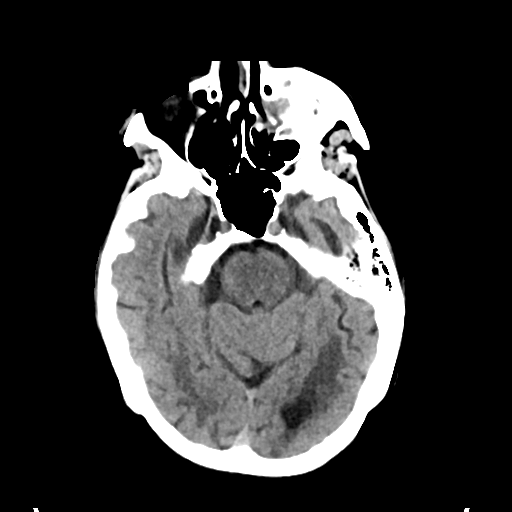
[im 18/35  brain]
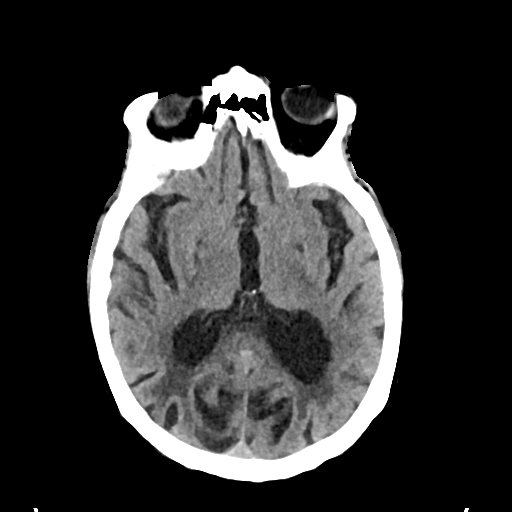
[im 22/35  brain]
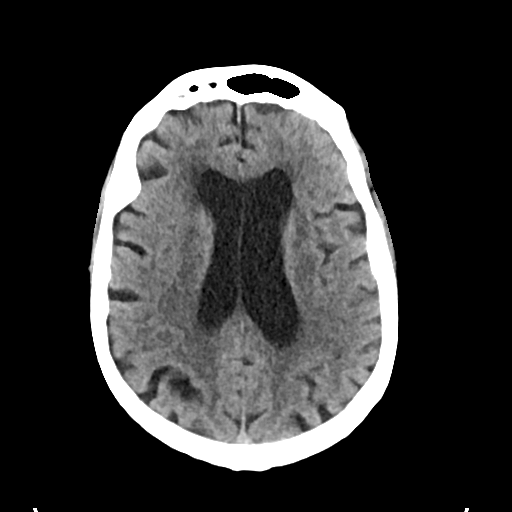
[im 22/35  bone]
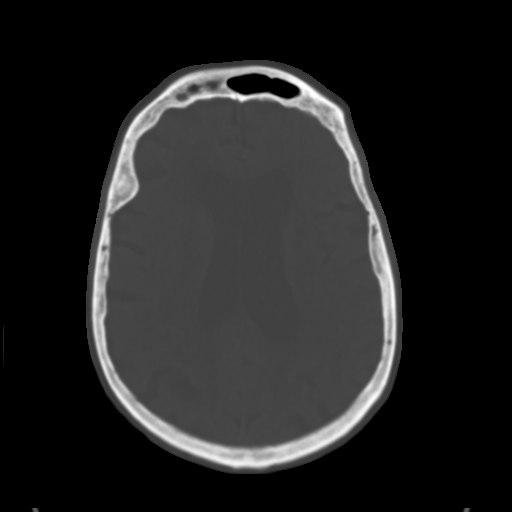
[im 26/35  brain]
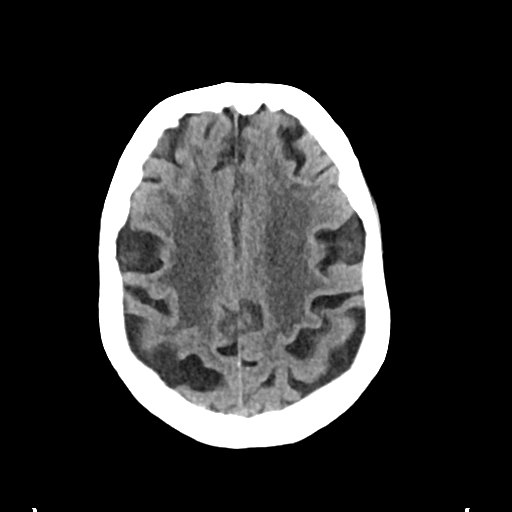
[im 30/35  brain]
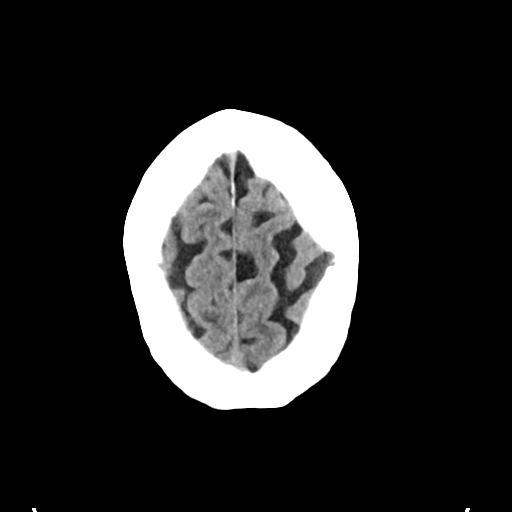

[Series 4: head bone · axial · 0.39mm/px · z∈[-176,-158]mm · 2 of 87 slices shown]
[im 9/87  bone]
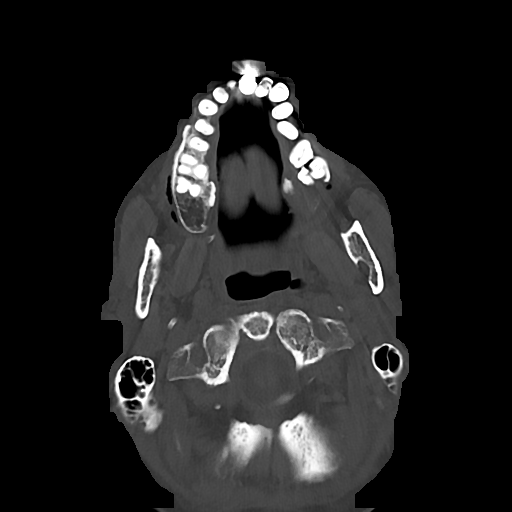
[im 18/87  bone]
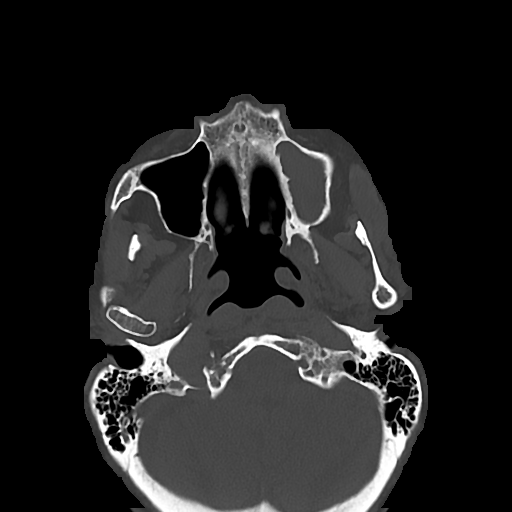

[Series 5: cor soft · coronal · 0.34mm/px · 3 of 74 slices shown]
[im 25/74  brain]
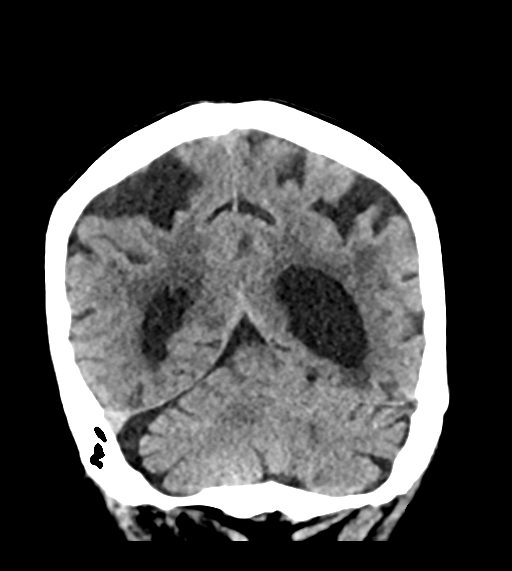
[im 33/74  brain]
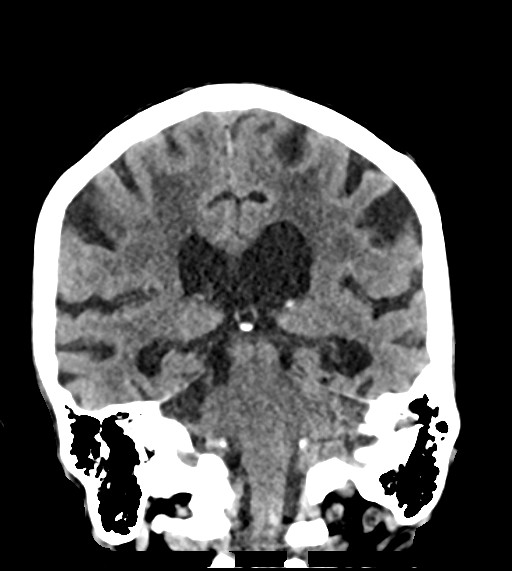
[im 41/74  brain]
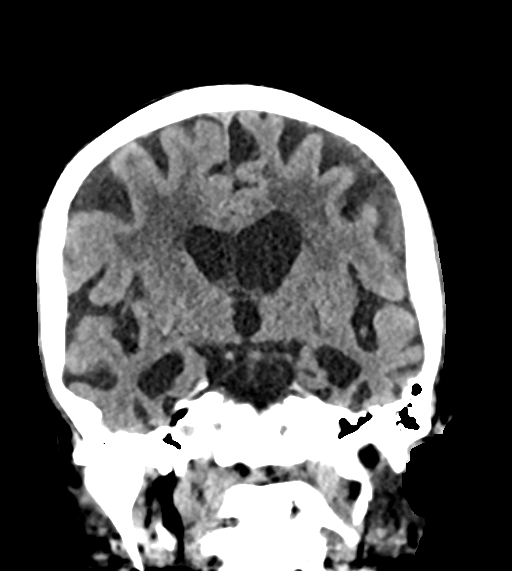

[Series 6: sag soft · sagittal · 0.34mm/px · 3 of 66 slices shown]
[im 22/66  brain]
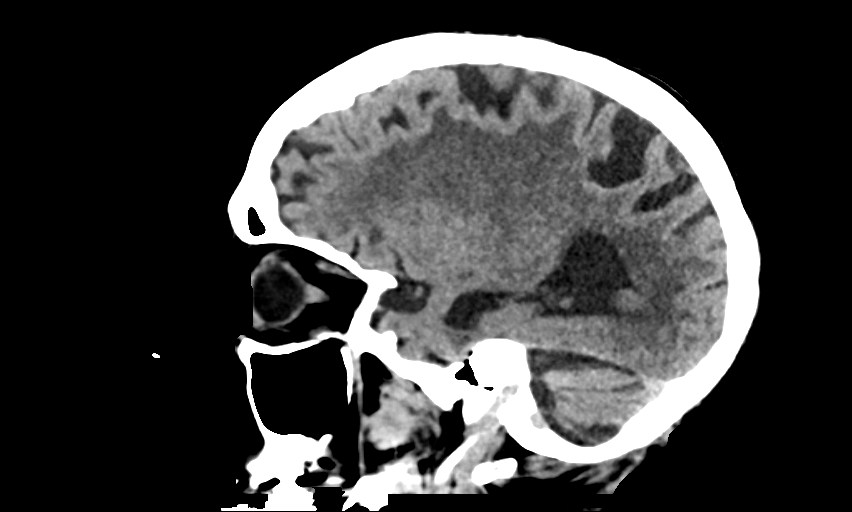
[im 33/66  brain]
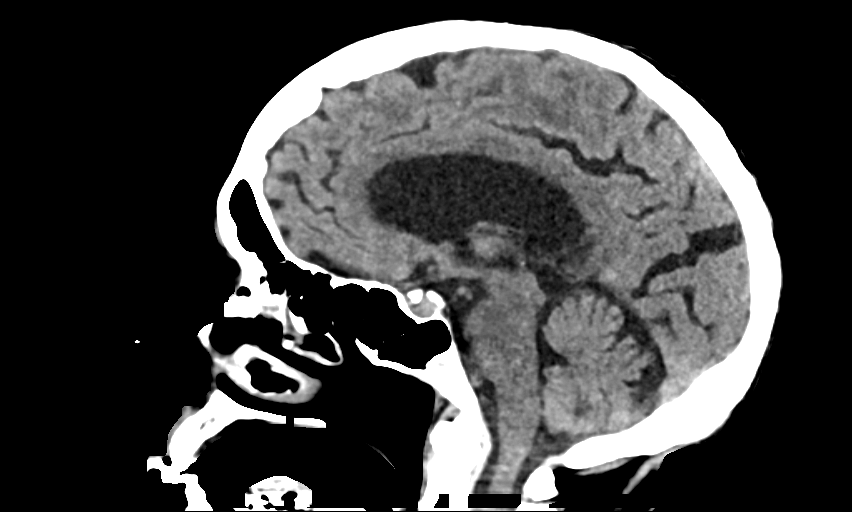
[im 44/66  brain]
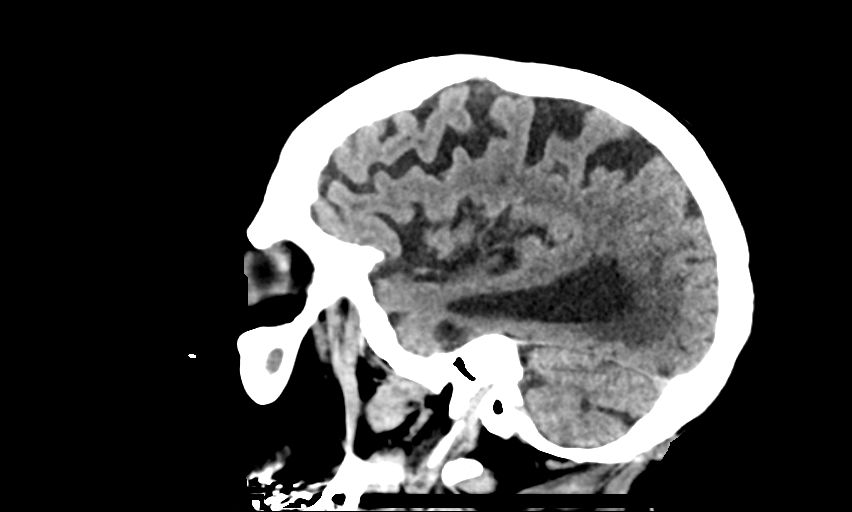

[15 of 47 positions shown; findings below may reference images not displayed]

FINDINGS: Brain: There is no mass, hemorrhage or extra-axial collection. There
is generalized atrophy without lobar predilection. Hypodensity of
the white matter is most commonly associated with chronic
microvascular disease. Old bilateral basal ganglia lacunar infarcts.
Is an old right occipital lobe infarct.

Vascular: No abnormal hyperdensity of the major intracranial
arteries or dural venous sinuses. No intracranial atherosclerosis.

Skull: The visualized skull base, calvarium and extracranial soft
tissues are normal.

Sinuses/Orbits: Complete opacification of the left maxillary sinus.
The orbits are normal.
IMPRESSION: 1. No acute intracranial hemorrhage.
2. Atrophy and advanced chronic microvascular disease.
3. ASPECTS is 10.

These results were communicated to Dr. Jevan Tiger at [DATE] on
02/13/2019 by text page via the [employer] messaging system.
